# Patient Record
Sex: Female | Born: 1965 | Race: White | Hispanic: No | Marital: Married | Smoking: Never smoker
Health system: Southern US, Community
[De-identification: ages and names within clinical notes are randomized; demographics above are authoritative.]

## PROBLEM LIST (undated history)

## (undated) HISTORY — PX: WISDOM TOOTH EXTRACTION: SHX21

## (undated) HISTORY — PX: HERNIA REPAIR: SHX51

---

## 2004-10-03 ENCOUNTER — Other Ambulatory Visit: Admission: RE | Admit: 2004-10-03 | Discharge: 2004-10-03 | Payer: Self-pay | Admitting: Obstetrics and Gynecology

## 2005-10-08 ENCOUNTER — Other Ambulatory Visit: Admission: RE | Admit: 2005-10-08 | Discharge: 2005-10-08 | Payer: Self-pay | Admitting: Obstetrics and Gynecology

## 2008-12-20 ENCOUNTER — Ambulatory Visit (HOSPITAL_COMMUNITY): Admission: RE | Admit: 2008-12-20 | Discharge: 2008-12-20 | Payer: Self-pay | Admitting: Obstetrics and Gynecology

## 2009-12-15 ENCOUNTER — Ambulatory Visit: Payer: Self-pay | Admitting: Internal Medicine

## 2009-12-15 DIAGNOSIS — J309 Allergic rhinitis, unspecified: Secondary | ICD-10-CM | POA: Insufficient documentation

## 2009-12-15 LAB — CONVERTED CEMR LAB: Blood Glucose, Fingerstick: 102

## 2009-12-22 ENCOUNTER — Encounter: Payer: Self-pay | Admitting: Internal Medicine

## 2010-02-02 NOTE — Assessment & Plan Note (Signed)
Summary: BRAND NEW PT/TO EST/CJR   Vital Signs:  Patient profile:   45 year old female Height:      67 inches Weight:      168 pounds BMI:     26.41 Temp:     98.2 degrees F oral BP sitting:   100 / 70  (left arm) Cuff size:   regular  Vitals Entered By: Duard Brady LPN (December 15, 2009 1:48 PM) CC: new to establish - Is Patient Diabetic? No CBG Result 102   CC:  new to establish -.  History of Present Illness: 45 year old patient who is in today to establish with our practice.  She has enjoyed excellent health.  She has been followed by GYN and is considering surgery for stress urinary incontinence.  She has a history of mild allergic rhinitis.  Her chief complaint is low back pain that has been intermittently  bothersome for several weeks.  She has improved over the past couple of days however.  Preventive Screening-Counseling & Management  Alcohol-Tobacco     Smoking Status: never  Allergies (verified): 1)  ! Erythromycin  Past History:  Past Medical History: low-back pain history of PMS stress urinary incontinence Allergic rhinitis  Family History: Reviewed history and no changes required. father at age 21- history of type 2 diabetes hypertriglyceridemia, hypertension mother, age 9, low back pain, dyslipidemia  One brother, history of peptic ulcer disease  Social History: Reviewed history and no changes required. 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    Physical Exam  General:  Well-developed,well-nourished,in no acute distress; alert,appropriate and cooperative throughout examination Head:  Normocephalic and atraumatic without obvious abnormalities. No apparent alopecia or balding. Eyes:  No corneal or conjunctival inflammation noted. EOMI. Perrla. Funduscopic exam benign, without hemorrhages, exudates or papilledema. Vision grossly normal. Ears:  External ear exam shows no significant lesions or deformities.  Otoscopic examination reveals clear canals, tympanic membranes are intact bilaterally without bulging, retraction, inflammation or discharge. Hearing is grossly normal bilaterally. Mouth:  Oral mucosa and oropharynx without lesions or exudates.  Teeth in good repair. Neck:  No deformities, masses, or tenderness noted. Chest Wall:  No deformities, masses, or tenderness noted. Lungs:  Normal respiratory effort, chest expands symmetrically. Lungs are clear to auscultation, no crackles or wheezes. Heart:  Normal rate and regular rhythm. S1 and S2 normal without gallop, murmur, click, rub or other extra sounds. Abdomen:  Bowel sounds positive,abdomen soft and non-tender without masses, organomegaly or hernias noted. Msk:  No deformity or scoliosis noted of thoracic or lumbar spine.    negative straight leg test Pulses:  R and L carotid,radial,femoral,dorsalis pedis and posterior tibial pulses are full and equal bilaterally Extremities:  No clubbing, cyanosis, edema, or deformity noted with normal full range of motion of all joints.   Neurologic:  strength normal in all extremities, sensation intact to pinprick, gait normal, and finger-to-nose normal.   Skin:  Intact without suspicious lesions or rashes Cervical Nodes:  No lymphadenopathy noted Axillary Nodes:  No palpable lymphadenopathy Inguinal Nodes:  No significant adenopathy Psych:  Cognition and  judgment appear intact. Alert and cooperative with normal attention span and concentration. No apparent delusions, illusions, hallucinations   Impression & Recommendations:  Problem # 1:  Preventive Health Care (ICD-V70.0)  Complete Medication List: 1)  Claritin 10 Mg Tabs (Loratadine) .... Qd 2)  Fluoxetine Hcl 20 Mg Caps (Fluoxetine hcl) .... Once daily 3)  Cyclobenzaprine Hcl 10 Mg Tabs (Cyclobenzaprine hcl) .... One every 8 hours as needed for back pain  Other Orders: Capillary Blood Glucose/CBG (91478)  Patient Instructions: 1)  Most patients (90%) with low back pain will improve with time (2-6 weeks). Keep active but avoid activities that are painful. Apply moist heat and/or ice to lower back several times a day. 2)  Aleve two tablets twice daily as needed  Prescriptions: CYCLOBENZAPRINE HCL 10 MG TABS (CYCLOBENZAPRINE HCL) one every 8 hours as needed for back pain  #30 x 4   Entered and Authorized by:   Gordy Savers  MD   Signed by:   Gordy Savers  MD on 12/15/2009   Method used:   Print then Give to Patient   RxID:   [id number]  Low  Orders Added: 1)  New Patient 40-64 years [99386] 2)  Capillary Blood Glucose/CBG [[id number]]

## 2010-02-02 NOTE — Letter (Signed)
Summary: PATIENT HX FORM  PATIENT HX FORM   Imported By: Georgian Co 12/22/2009 14:23:18  _____________________________________________________________________  External Attachment:    Type:   Image     Comment:   External Document

## 2010-10-17 ENCOUNTER — Other Ambulatory Visit (HOSPITAL_COMMUNITY): Payer: Self-pay | Admitting: Obstetrics and Gynecology

## 2010-10-17 DIAGNOSIS — Z1231 Encounter for screening mammogram for malignant neoplasm of breast: Secondary | ICD-10-CM

## 2010-11-08 ENCOUNTER — Ambulatory Visit (HOSPITAL_COMMUNITY)
Admission: RE | Admit: 2010-11-08 | Discharge: 2010-11-08 | Disposition: A | Discharge status: Home / Self Care | Payer: BC Managed Care – PPO | Source: Ambulatory Visit | Attending: Obstetrics and Gynecology | Admitting: Obstetrics and Gynecology

## 2010-11-08 DIAGNOSIS — Z1231 Encounter for screening mammogram for malignant neoplasm of breast: Secondary | ICD-10-CM | POA: Insufficient documentation

## 2011-10-29 ENCOUNTER — Encounter: Payer: Self-pay | Admitting: Obstetrics and Gynecology

## 2011-10-29 ENCOUNTER — Ambulatory Visit (INDEPENDENT_AMBULATORY_CARE_PROVIDER_SITE_OTHER): Payer: BC Managed Care – PPO | Admitting: Obstetrics and Gynecology

## 2011-10-29 VITALS — BP 108/60 | HR 66 | Ht 66.5 in | Wt 160.0 lb

## 2011-10-29 DIAGNOSIS — Z124 Encounter for screening for malignant neoplasm of cervix: Secondary | ICD-10-CM

## 2011-10-29 MED ORDER — FLUOXETINE HCL 20 MG PO TABS: 20.0000 mg | ORAL_TABLET | Freq: Every day | ORAL | Status: AC

## 2011-10-29 MED ORDER — CLOTRIMAZOLE-BETAMETHASONE 1-0.05 % EX CREA: TOPICAL_CREAM | Freq: Two times a day (BID) | CUTANEOUS | Status: DC

## 2011-10-29 NOTE — Progress Notes (Signed)
Last Pap: 10/17/10 WNL: Yes Regular Periods:yes Contraception: husband vasectomy   Monthly Breast exam:no Tetanus<64yrs:no pt unsure  Nl.Bladder Function:no stress incontinence  Daily BMs:yes Healthy Diet:yes Calcium:no Mammogram:yes Date of Mammogram: 11/08/10 Exercise:yes Have often Exercise: twice per week  Seatbelt: yes Abuse at home: no Stressful work:no Sigmoid-colonoscopy: n/a Bone Density: No PCP: Dr. Amador Cunas Change in PMH: none Change in WUJ:WJXB BP 108/60  Pulse 66  Ht 5' 6.5" (1.689 m)  Wt 160 lb (72.576 kg)  BMI 25.44 kg/m2  LMP 10/21/2011 Pt with complaints:no Physical Examination: General appearance - alert, well appearing, and in no distress Mental status - normal mood, behavior, speech, dress, motor activity, and thought processes Neck - supple, no significant adenopathy,  thyroid exam: thyroid is normal in size without nodules or tenderness Chest - clear to auscultation, no wheezes, rales or rhonchi, symmetric air entry Heart - normal rate and regular rhythm Abdomen - soft, nontender, nondistended, no masses or organomegaly Breasts - breasts appear normal, no suspicious masses, no skin or nipple changes or axillary nodes Pelvic - normal external genitalia, vulva, vagina, cervix, uterus and adnexa Rectal - rectal exam not indicated Back exam - full range of motion, no tenderness, palpable spasm or pain on motion Neurological - alert, oriented, normal speech, no focal findings or movement disorder noted Musculoskeletal - no joint tenderness, deformity or swelling Extremities - no edema, redness or tenderness in the calves or thighs Skin - normal coloration and turgor, no rashes, no suspicious skin lesions noted Routine exam Pap sent yes Mammogram due ye pt will schedule vasectomy used for contraception RT 1 yr.  Pt desires to continue fluoxitine for PMS lotrisone rx for yeast on buttock.  hygeine also reviewed

## 2011-10-30 LAB — PAP IG W/ RFLX HPV ASCU

## 2011-11-19 ENCOUNTER — Ambulatory Visit (INDEPENDENT_AMBULATORY_CARE_PROVIDER_SITE_OTHER): Payer: BC Managed Care – PPO | Admitting: Internal Medicine

## 2011-11-19 ENCOUNTER — Encounter: Payer: Self-pay | Admitting: Internal Medicine

## 2011-11-19 VITALS — BP 116/80 | HR 68 | Temp 98.0°F | Resp 16 | Wt 160.0 lb

## 2011-11-19 DIAGNOSIS — L259 Unspecified contact dermatitis, unspecified cause: Secondary | ICD-10-CM

## 2011-11-19 DIAGNOSIS — J309 Allergic rhinitis, unspecified: Secondary | ICD-10-CM

## 2011-11-19 MED ORDER — METHYLPREDNISOLONE ACETATE 80 MG/ML IJ SUSP
80.0000 mg | Freq: Once | INTRAMUSCULAR | Status: AC
  Administered 2011-11-19: 80 mg via INTRAMUSCULAR

## 2011-11-19 MED ORDER — TRIAMCINOLONE ACETONIDE 0.1 % EX CREA: TOPICAL_CREAM | Freq: Two times a day (BID) | CUTANEOUS | Status: DC

## 2011-11-19 NOTE — Patient Instructions (Signed)
Contact Dermatitis Contact dermatitis is a rash that happens when something touches the skin. You touched something that irritates your skin, or you have allergies to something you touched. HOME CARE   Avoid the thing that caused your rash.  Keep your rash away from hot water, soap, sunlight, chemicals, and other things that might bother it.  Do not scratch your rash.  You can take cool baths to help stop itching.  Only take medicine as told by your doctor.  Keep all doctor visits as told. GET HELP RIGHT AWAY IF:   Your rash is not better after 3 days.  Your rash gets worse.  Your rash is puffy (swollen), tender, red, sore, or warm.  You have problems with your medicine. MAKE SURE YOU:   Understand these instructions.  Will watch your condition.  Will get help right away if you are not doing well or get worse. Document Released: 10/15/2008 Document Revised: 03/12/2011 Document Reviewed: 05/23/2010 [employer] Patient Information 2013 [employer].  

## 2011-11-19 NOTE — Progress Notes (Signed)
  Subjective:    Patient ID: Christie Lara, female    DOB: 06-16-1965, 46 y.o.   MRN: [id number]  HPI  46 year old patient who is seen today with a chief complaint of a rash.  This began for 5 days ago and initially began on her left hand and for head. Assess for spread to the right hand abdomen and neck area. 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 Rash is quite pruritic. She has been using calamine lotion with benefit  No past medical history on file.  History   Social History  . Marital Status: Married    Spouse Name: N/A    Number of Children: N/A  . Years of Education: N/A   Occupational History  . Not on file.   Social History Main Topics  . Smoking status: Never Smoker   . Smokeless tobacco: Not on file  . Alcohol Use: Yes     Comment: glass of wine at dinner  . Drug Use: Not on file  . Sexually Active: Yes    Birth Control/ Protection: Surgical     Comment: husband vasectomy   Other Topics Concern  . Not on file   Social History Narrative  . No narrative on file    No past surgical history on file.  No family history on file.  Allergies  Allergen Reactions  . Erythromycin     REACTION: nausea    Current Outpatient Prescriptions on File Prior to Visit  Medication Sig Dispense Refill  . clotrimazole-betamethasone (LOTRISONE) cream Apply topically 2 (two) times daily. Use for 7- 10 days as needed  30 g  0  . FLUoxetine (PROZAC) 20 MG tablet Take 1 tablet (20 mg total) by mouth daily.  30 tablet  11  . loratadine-pseudoephedrine (CLARITIN-D 12-HOUR) 5-120 MG per tablet Take 1 tablet by mouth 2 (two) times daily.        BP 116/80  Pulse 68  Temp 98 F (36.7 C) (Oral)  Resp 16  Wt 160 lb (72.576 kg)  LMP 10/21/2011       Review of Systems  Constitutional: Negative.   HENT: Negative for hearing loss, congestion, sore throat, rhinorrhea, dental problem, sinus pressure and tinnitus.   Eyes: Negative for pain,  discharge and visual disturbance.  Respiratory: Negative for cough and shortness of breath.   Cardiovascular: Negative for chest pain, palpitations and leg swelling.  Gastrointestinal: Negative for nausea, vomiting, abdominal pain, diarrhea, constipation, blood in stool and abdominal distention.  Genitourinary: Negative for dysuria, urgency, frequency, hematuria, flank pain, vaginal bleeding, vaginal discharge, difficulty urinating, vaginal pain and pelvic pain.  Musculoskeletal: Negative for joint swelling, arthralgias and gait problem.  Skin: Positive for rash.  Neurological: Negative for dizziness, syncope, speech difficulty, weakness, numbness and headaches.  Hematological: Negative for adenopathy.  Psychiatric/Behavioral: Negative for behavioral problems, dysphoric mood and agitation. The patient is not nervous/anxious.        Objective:   Physical Exam  Constitutional: She appears well-developed and well-nourished. No distress.  Skin: Rash noted.       Scattered areas of erythema with papules and scaling involving primarily the hands face left shoulder and abdomen. Consistent with a contact dermatitis.          Assessment & Plan:    Content dermatitis. Will treat with Depo-Medrol. Will also give the prescription for triamcinolone. She will avoid the facial areas

## 2011-11-23 ENCOUNTER — Encounter: Payer: Self-pay | Admitting: Family Medicine

## 2011-11-23 ENCOUNTER — Telehealth: Payer: Self-pay | Admitting: Family Medicine

## 2011-11-23 ENCOUNTER — Ambulatory Visit (INDEPENDENT_AMBULATORY_CARE_PROVIDER_SITE_OTHER): Payer: BC Managed Care – PPO | Admitting: Family Medicine

## 2011-11-23 VITALS — BP 100/70 | HR 83 | Temp 98.0°F | Wt 156.0 lb

## 2011-11-23 DIAGNOSIS — L259 Unspecified contact dermatitis, unspecified cause: Secondary | ICD-10-CM

## 2011-11-23 MED ORDER — PREDNISONE 20 MG PO TABS: ORAL_TABLET | ORAL | Status: DC

## 2011-11-23 NOTE — Telephone Encounter (Signed)
error 

## 2011-11-23 NOTE — Progress Notes (Signed)
Chief Complaint  Patient presents with  . Abrasion    no better since Monday     HPI:  Here for acute visit for rash: -started: about 1 week ago -on [facility] saw PCP (4days ago) 11/18 for rash and dx with contact dermatitis - tx with depo-medrol inj and triamcinolone -rash is on L hand, right side of hand, neck -new spots were happening before seen, not spreading much since last visit, but still itchy and not resolved, maybe some new spots on belly, none near eye and eyes ok, no swelling of lips or mouth or trouble breathing -doesn't know of exposure, does have dogs that go outside -denies: fevers, chills, NVD  ROS: See pertinent positives and negatives per HPI.  No past medical history on file.  No family history on file.  History   Social History  . Marital Status: Married    Spouse Name: N/A    Number of Children: N/A  . Years of Education: N/A   Social History Main Topics  . Smoking status: Never Smoker   . Smokeless tobacco: None  . Alcohol Use: Yes     Comment: glass of wine at dinner  . Drug Use: None  . Sexually Active: Yes    Birth Control/ Protection: Surgical     Comment: husband vasectomy   Other Topics Concern  . None   Social History Narrative  . None    Current outpatient prescriptions:clotrimazole-betamethasone (LOTRISONE) cream, Apply topically 2 (two) times daily. Use for 7- 10 days as needed, Disp: 30 g, Rfl: 0;  FLUoxetine (PROZAC) 20 MG tablet, Take 1 tablet (20 mg total) by mouth daily., Disp: 30 tablet, Rfl: 11;  loratadine-pseudoephedrine (CLARITIN-D 12-HOUR) 5-120 MG per tablet, Take 1 tablet by mouth 2 (two) times daily., Disp: , Rfl:  triamcinolone cream (KENALOG) 0.1 %, Apply topically 2 (two) times daily., Disp: 30 g, Rfl: 0;  predniSONE (DELTASONE) 20 MG tablet, 3 tablets daily for 2 days, then 2 tablets daily for 4 days, then 1 tablet daily for 4 days, then 1/2 tablet daily for 4 days, Disp: 20 tablet, Rfl: 0  EXAM:  Filed Vitals:   11/23/11 1358  BP: 100/70  Pulse: 83  Temp: 98 F (36.7 C)    There is no height on file to calculate BMI.  GENERAL: vitals reviewed and listed above, alert, oriented, appears well hydrated and in no acute distress  SKIN: scaly erythematous papules and plaques on hands, abdomen, neck, flank and side of face  MS: moves all extremities without noticeable abnormality  PSYCH: pleasant and cooperative, no obvious depression or anxiety  ASSESSMENT AND PLAN:  Discussed the following assessment and plan:  1. Contact dermatitis  predniSONE (DELTASONE) 20 MG tablet Calamine, Claritin, return precuation   -discussed does appear to be contact dermatitis and looks like toxicodendron dermatitis progression had halted with steroid injection, but now spreading again -given extensive area and that there are lesions on face discussed risks/versus benefit of oral steroid taper -discussed that there is a potential for rebound when steroid stopped in small number of cases -pt would like to do oral steroid and will follow up with PCP in 2 weeks -Patient advised to return or notify a doctor immediately if symptoms worsen or persist or new concerns arise.  Patient Instructions  Contact Dermatitis Contact dermatitis is a reaction to certain substances that touch the skin. Contact dermatitis can be either irritant contact dermatitis or allergic contact dermatitis. Irritant contact dermatitis does not require previous  exposure to the substance for a reaction to occur.Allergic contact dermatitis only occurs if you have been exposed to the substance before. Upon a repeat exposure, your body reacts to the substance.  CAUSES  Many substances can cause contact dermatitis. Irritant dermatitis is most commonly caused by repeated exposure to mildly irritating substances, such as:  Makeup.  Plants  Soaps.  Detergents.  Bleaches.  Acids.  Metal salts, such as nickel. Allergic contact dermatitis is most  commonly caused by exposure to:  Poisonous plants.  Chemicals (deodorants, shampoos).  Jewelry.  Latex.  Neomycin in triple antibiotic cream.  Preservatives in products, including clothing. SYMPTOMS  The area of skin that is exposed may develop:  Dryness or flaking.  Redness.  Cracks.  Itching.  Pain or a burning sensation.  Blisters. With allergic contact dermatitis, there may also be swelling in areas such as the eyelids, mouth, or genitals.  DIAGNOSIS  Your caregiver can usually tell what the problem is by doing a physical exam. In cases where the cause is uncertain and an allergic contact dermatitis is suspected, a patch skin test may be performed to help determine the cause of your dermatitis. TREATMENT Treatment includes protecting the skin from further contact with the irritating substance by avoiding that substance if possible. Barrier creams, powders, and gloves may be helpful. Your caregiver may also recommend:  Steroid creams or ointments applied 2 times daily. For best results, soak the rash area in cool water for 20 minutes. Then apply the medicine. Cover the area with a plastic wrap. You can store the steroid cream in the refrigerator for a "chilly" effect on your rash. That may decrease itching. Oral steroid medicines may be needed in more severe cases.  Antibiotics or antibacterial ointments if a skin infection is present.  Antihistamine lotion or an antihistamine taken by mouth to ease itching.  Lubricants to keep moisture in your skin.  Burow's solution to reduce redness and soreness or to dry a weeping rash. Mix one packet or tablet of solution in 2 cups cool water. Dip a clean washcloth in the mixture, wring it out a bit, and put it on the affected area. Leave the cloth in place for 30 minutes. Do this as often as possible throughout the day.  Taking several cornstarch or baking soda baths daily if the area is too large to cover with a washcloth. Harsh  chemicals, such as alkalis or acids, can cause skin damage that is like a burn. You should flush your skin for 15 to 20 minutes with cold water after such an exposure. You should also seek immediate medical care after exposure. Bandages (dressings), antibiotics, and pain medicine may be needed for severely irritated skin.  HOME CARE INSTRUCTIONS  Avoid the substance that caused your reaction.  Keep the area of skin that is affected away from hot water, soap, sunlight, chemicals, acidic substances, or anything else that would irritate your skin.  Do not scratch the rash. Scratching may cause the rash to become infected.  You may take cool baths to help stop the itching.  Only take over-the-counter or prescription medicines as directed by your caregiver.  See your caregiver for follow-up care as directed to make sure your skin is healing properly. SEEK MEDICAL CARE IF:   Your condition is not better after 3 days of treatment.  You seem to be getting worse.  You see signs of infection such as swelling, tenderness, redness, soreness, or warmth in the affected area.  You  have any problems related to your medicines. Document Released: 12/16/1999 Document Revised: 03/12/2011 Document Reviewed: 05/23/2010 [facility] Patient Information 2013 [place], [place], [place].

## 2011-11-23 NOTE — Patient Instructions (Addendum)
Contact Dermatitis Contact dermatitis is a reaction to certain substances that touch the skin. Contact dermatitis can be either irritant contact dermatitis or allergic contact dermatitis. Irritant contact dermatitis does not require previous exposure to the substance for a reaction to occur.Allergic contact dermatitis only occurs if you have been exposed to the substance before. Upon a repeat exposure, your body reacts to the substance.  CAUSES  Many substances can cause contact dermatitis. Irritant dermatitis is most commonly caused by repeated exposure to mildly irritating substances, such as:  Makeup.  Plants  Soaps.  Detergents.  Bleaches.  Acids.  Metal salts, such as nickel. Allergic contact dermatitis is most commonly caused by exposure to:  Poisonous plants.  Chemicals (deodorants, shampoos).  Jewelry.  Latex.  Neomycin in triple antibiotic cream.  Preservatives in products, including clothing. SYMPTOMS  The area of skin that is exposed may develop:  Dryness or flaking.  Redness.  Cracks.  Itching.  Pain or a burning sensation.  Blisters. With allergic contact dermatitis, there may also be swelling in areas such as the eyelids, mouth, or genitals.  DIAGNOSIS  Your caregiver can usually tell what the problem is by doing a physical exam. In cases where the cause is uncertain and an allergic contact dermatitis is suspected, a patch skin test may be performed to help determine the cause of your dermatitis. TREATMENT Treatment includes protecting the skin from further contact with the irritating substance by avoiding that substance if possible. Barrier creams, powders, and gloves may be helpful. Your caregiver may also recommend:  Steroid creams or ointments applied 2 times daily. For best results, soak the rash area in cool water for 20 minutes. Then apply the medicine. Cover the area with a plastic wrap. You can store the steroid cream in the refrigerator for  a "chilly" effect on your rash. That may decrease itching. Oral steroid medicines may be needed in more severe cases.  Antibiotics or antibacterial ointments if a skin infection is present.  Antihistamine lotion or an antihistamine taken by mouth to ease itching.  Lubricants to keep moisture in your skin.  Burow's solution to reduce redness and soreness or to dry a weeping rash. Mix one packet or tablet of solution in 2 cups cool water. Dip a clean washcloth in the mixture, wring it out a bit, and put it on the affected area. Leave the cloth in place for 30 minutes. Do this as often as possible throughout the day.  Taking several cornstarch or baking soda baths daily if the area is too large to cover with a washcloth. Harsh chemicals, such as alkalis or acids, can cause skin damage that is like a burn. You should flush your skin for 15 to 20 minutes with cold water after such an exposure. You should also seek immediate medical care after exposure. Bandages (dressings), antibiotics, and pain medicine may be needed for severely irritated skin.  HOME CARE INSTRUCTIONS  Avoid the substance that caused your reaction.  Keep the area of skin that is affected away from hot water, soap, sunlight, chemicals, acidic substances, or anything else that would irritate your skin.  Do not scratch the rash. Scratching may cause the rash to become infected.  You may take cool baths to help stop the itching.  Only take over-the-counter or prescription medicines as directed by your caregiver.  See your caregiver for follow-up care as directed to make sure your skin is healing properly. SEEK MEDICAL CARE IF:   Your condition is not better  after 3 days of treatment.  You seem to be getting worse.  You see signs of infection such as swelling, tenderness, redness, soreness, or warmth in the affected area.  You have any problems related to your medicines. Document Released: 12/16/1999 Document Revised:  03/12/2011 Document Reviewed: 05/23/2010 [facility] Patient Information 2013 [place], [place].

## 2011-12-05 ENCOUNTER — Ambulatory Visit: Payer: BC Managed Care – PPO | Admitting: Internal Medicine

## 2012-05-23 ENCOUNTER — Other Ambulatory Visit: Payer: Self-pay | Admitting: Obstetrics and Gynecology

## 2012-05-23 DIAGNOSIS — Z1231 Encounter for screening mammogram for malignant neoplasm of breast: Secondary | ICD-10-CM

## 2012-05-29 ENCOUNTER — Ambulatory Visit (HOSPITAL_COMMUNITY)
Admission: RE | Admit: 2012-05-29 | Discharge: 2012-05-29 | Disposition: A | Discharge status: Home / Self Care | Payer: BC Managed Care – PPO | Source: Ambulatory Visit | Attending: Obstetrics and Gynecology | Admitting: Obstetrics and Gynecology

## 2012-05-29 DIAGNOSIS — Z1231 Encounter for screening mammogram for malignant neoplasm of breast: Secondary | ICD-10-CM

## 2013-03-20 ENCOUNTER — Ambulatory Visit (INDEPENDENT_AMBULATORY_CARE_PROVIDER_SITE_OTHER): Payer: BC Managed Care – PPO | Admitting: Physician Assistant

## 2013-03-20 VITALS — BP 108/64 | HR 80 | Temp 97.6°F | Ht 66.5 in | Wt 145.8 lb

## 2013-03-20 DIAGNOSIS — N390 Urinary tract infection, site not specified: Secondary | ICD-10-CM

## 2013-03-20 DIAGNOSIS — R300 Dysuria: Secondary | ICD-10-CM

## 2013-03-20 DIAGNOSIS — R350 Frequency of micturition: Secondary | ICD-10-CM

## 2013-03-20 LAB — POCT URINALYSIS DIPSTICK
Bilirubin, UA: NEGATIVE
Glucose, UA: NEGATIVE
Ketones, UA: NEGATIVE
Nitrite, UA: NEGATIVE
Spec Grav, UA: <=1.005
Urobilinogen, UA: 0.2
pH, UA: 7

## 2013-03-20 LAB — POCT UA - MICROSCOPIC ONLY
Casts, Ur, LPF, POC: NEGATIVE
Crystals, Ur, HPF, POC: NEGATIVE
Mucus, UA: NEGATIVE
RBC, urine, microscopic: NEGATIVE
Yeast, UA: NEGATIVE

## 2013-03-20 MED ORDER — PHENAZOPYRIDINE HCL 200 MG PO TABS: 200.0000 mg | ORAL_TABLET | Freq: Three times a day (TID) | ORAL | Status: DC | PRN

## 2013-03-20 MED ORDER — CIPROFLOXACIN HCL 250 MG PO TABS: 250.0000 mg | ORAL_TABLET | Freq: Two times a day (BID) | ORAL | Status: DC

## 2013-03-20 NOTE — Progress Notes (Signed)
   Subjective:    Patient ID: Christie Lara, female    DOB: 03/14/1965, 48 y.o.   MRN: [id number]  HPI [age] year female presents for evaluation of possible UTI. She woke up this morning with suprapubic pressure, dysuria, and urinary frequency. Has had slight nausea and lower abdominal pain. Denies emesis, fever, chills, flank pain, vaginal discharge, odor, or hematuria.  She has had UTI's in the past but it was "years" ago.  No OTC treatments tried.  Patient is otherwise healthy with no other concerns today.     Review of Systems  Constitutional: Negative for fever.  Gastrointestinal: Positive for nausea and abdominal pain. Negative for vomiting.  Genitourinary: Positive for dysuria and frequency. Negative for flank pain, vaginal discharge and pelvic pain.       Objective:   Physical Exam  Constitutional: She is oriented to person, place, and time. She appears well-developed and well-nourished.  HENT:  Head: Normocephalic and atraumatic.  Right Ear: External ear normal.  Left Ear: External ear normal.  Eyes: Conjunctivae are normal.  Neck: Normal range of motion.  Cardiovascular: Normal rate, regular rhythm and normal heart sounds.   Pulmonary/Chest: Effort normal and breath sounds normal.  Abdominal: There is tenderness in the suprapubic area. There is no rebound, no guarding and no CVA tenderness.  Neurological: She is alert and oriented to person, place, and time.  Psychiatric: She has a normal mood and affect. Her behavior is normal. Judgment and thought content normal.     Results for orders placed in visit on 03/20/13  POCT UA - MICROSCOPIC ONLY      Result Value Ref Range   WBC, Ur, HPF, POC TNTC     RBC, urine, microscopic neg     Bacteria, U Microscopic 1+     Mucus, UA neg     Epithelial cells, urine per micros 1-4     Crystals, Ur, HPF, POC neg     Casts, Ur, LPF, POC neg     Yeast, UA neg     Clue Cells Wet Prep HPF POC Moderate    POCT URINALYSIS DIPSTICK    Result Value Ref Range   Color, UA lt yellow     Clarity, UA cloudy     Glucose, UA neg     Bilirubin, UA neg     Ketones, UA neg     Spec Grav, UA <=1.005     Blood, UA large     pH, UA 7.0     Protein, UA trace     Urobilinogen, UA 0.2     Nitrite, UA neg     Leukocytes, UA large (3+)          Assessment & Plan:   UTI (urinary tract infection)  Dysuria - Plan: POCT UA - Microscopic Only, POCT urinalysis dipstick, ciprofloxacin (CIPRO) 250 MG tablet, phenazopyridine (PYRIDIUM) 200 MG tablet, Urine culture  Urinary frequency  Will treat UTI with Cipro 250 mg bid x 5 days Pyridium 200 mg tid prn pain. Increase fluids and rest Urine culture sent RTC precautions discussed. Follow up if symptoms worsen or fail to improve.

## 2013-03-22 LAB — URINE CULTURE: Colony Count: >=100000

## 2013-07-02 ENCOUNTER — Encounter: Payer: Self-pay | Admitting: Internal Medicine

## 2013-07-02 ENCOUNTER — Ambulatory Visit (INDEPENDENT_AMBULATORY_CARE_PROVIDER_SITE_OTHER): Payer: BC Managed Care – PPO | Admitting: Internal Medicine

## 2013-07-02 VITALS — BP 108/70 | HR 74 | Temp 98.5°F | Resp 18 | Ht 66.5 in | Wt 149.0 lb

## 2013-07-02 DIAGNOSIS — L259 Unspecified contact dermatitis, unspecified cause: Secondary | ICD-10-CM

## 2013-07-02 MED ORDER — METHYLPREDNISOLONE ACETATE 80 MG/ML IJ SUSP
80.0000 mg | Freq: Once | INTRAMUSCULAR | Status: AC
  Administered 2013-07-02: 80 mg via INTRAMUSCULAR

## 2013-07-02 NOTE — Patient Instructions (Signed)
Call or return to clinic prn if these symptoms worsen or fail to improve as anticipated. Poison [employer] Poison ivy is a inflammation of the skin (contact dermatitis) caused by touching the allergens on the leaves of the ivy plant following previous exposure to the plant. The rash usually appears 48 hours after exposure. The rash is usually bumps (papules) or blisters (vesicles) in a linear pattern. Depending on your own sensitivity, the rash may simply cause redness and itching, or it may also progress to blisters which may break open. These must be well cared for to prevent secondary bacterial (germ) infection, followed by scarring. Keep any open areas dry, clean, dressed, and covered with an antibacterial ointment if needed. The eyes may also get puffy. The puffiness is worst in the morning and gets better as the day progresses. This dermatitis usually heals without scarring, within 2 to 3 weeks without treatment. HOME CARE INSTRUCTIONS  Thoroughly wash with soap and water as soon as you have been exposed to poison ivy. You have about one half hour to remove the plant resin before it will cause the rash. This washing will destroy the oil or antigen on the skin that is causing, or will cause, the rash. Be sure to wash under your fingernails as any plant resin there will continue to spread the rash. Do not rub skin vigorously when washing affected area. Poison ivy cannot spread if no oil from the plant remains on your body. A rash that has progressed to weeping sores will not spread the rash unless you have not washed thoroughly. It is also important to wash any clothes you have been wearing as these may carry active allergens. The rash will return if you wear the unwashed clothing, even several days later. Avoidance of the plant in the future is the best measure. Poison ivy plant can be recognized by the number of leaves. Generally, poison ivy has three leaves with flowering branches on a single  stem. Diphenhydramine may be purchased over the counter and used as needed for itching. Do not drive with this medication if it makes you drowsy.Ask your caregiver about medication for children. SEEK MEDICAL CARE IF:  Open sores develop.  Redness spreads beyond area of rash.  You notice purulent (pus-like) discharge.  You have increased pain.  Other signs of infection develop (such as fever). Document Released: 12/16/1999 Document Revised: 03/12/2011 Document Reviewed: 11/03/2008 [facility] Patient Information 2015 [place], [place]. This information is not intended to replace advice given to you by your health care provider. Make sure you discuss any questions you have with your health care provider.

## 2013-07-02 NOTE — Progress Notes (Signed)
Pre visit review using our clinic review tool, if applicable. No additional management support is needed unless otherwise documented below in the visit note. 

## 2013-07-02 NOTE — Addendum Note (Signed)
Addended by: Jimmye NormanPHANOS, Emberlie Gotcher J on: 07/02/2013 05:03 PM   Modules accepted: Orders

## 2013-07-02 NOTE — Progress Notes (Signed)
   Subjective:    Patient ID: Christie Lara, female    DOB: 03/08/1965, 48 y.o.   MRN: [id number]  HPI 48 year old patient, who presents with a one-week history of a rash involving primarily the lower extremities.  She describes a pruritic papules and vesicles some in a linear distribution.  No obvious exposure history, but she does do some gardening and does have a dog  History reviewed. No pertinent past medical history.  History   Social History  . Marital Status: Married    Spouse Name: N/A    Number of Children: N/A  . Years of Education: N/A   Occupational History  . Not on file.   Social History Main Topics  . Smoking status: Never Smoker   . Smokeless tobacco: Not on file  . Alcohol Use: Yes     Comment: glass of wine at dinner  . Drug Use: No  . Sexual Activity: Yes    Birth Control/ Protection: Surgical     Comment: husband vasectomy   Other Topics Concern  . Not on file   Social History Narrative  . No narrative on file    History reviewed. No pertinent past surgical history.  Family History  Problem Relation Age of Onset  . Hyperlipidemia Mother   . Diabetes Father   . Hypertension Father   . Cancer Father     Allergies  Allergen Reactions  . Erythromycin     REACTION: nausea    Current Outpatient Prescriptions on File Prior to Visit  Medication Sig Dispense Refill  . FLUoxetine (PROZAC) 20 MG tablet Take 1 tablet (20 mg total) by mouth daily.  30 tablet  11  . loratadine-pseudoephedrine (CLARITIN-D 12-HOUR) 5-120 MG per tablet Take 1 tablet by mouth 2 (two) times daily.       No current facility-administered medications on file prior to visit.    BP 108/70  Pulse 74  Temp(Src) 98.5 F (36.9 C) (Oral)  Resp 18  Ht 5' 6.5" (1.689 m)  Wt 149 lb (67.586 kg)  BMI 23.69 kg/m2  SpO2 98%      Review of Systems  Constitutional: Negative.   HENT: Negative for congestion, dental problem, hearing loss, rhinorrhea, sinus pressure, sore  throat and tinnitus.   Eyes: Negative for pain, discharge and visual disturbance.  Respiratory: Negative for cough and shortness of breath.   Cardiovascular: Negative for chest pain, palpitations and leg swelling.  Gastrointestinal: Negative for nausea, vomiting, abdominal pain, diarrhea, constipation, blood in stool and abdominal distention.  Genitourinary: Negative for dysuria, urgency, frequency, hematuria, flank pain, vaginal bleeding, vaginal discharge, difficulty urinating, vaginal pain and pelvic pain.  Musculoskeletal: Negative for arthralgias, gait problem and joint swelling.  Skin: Positive for rash.  Neurological: Negative for dizziness, syncope, speech difficulty, weakness, numbness and headaches.  Hematological: Negative for adenopathy.  Psychiatric/Behavioral: Negative for behavioral problems, dysphoric mood and agitation. The patient is not nervous/anxious.        Objective:   Physical Exam  Constitutional: She appears well-nourished. No distress.  Skin: Rash noted.  Scattered erythematous papules and vesicles with the some in linear distribution over the lower extremities          Assessment & Plan:   Contact dermatitis.  Will treat with Depo-Medrol 80 Local skin care discussed

## 2013-10-01 ENCOUNTER — Ambulatory Visit (INDEPENDENT_AMBULATORY_CARE_PROVIDER_SITE_OTHER): Payer: BC Managed Care – PPO | Admitting: Family Medicine

## 2013-10-01 VITALS — BP 102/66 | HR 72 | Temp 97.9°F | Resp 18 | Ht 67.5 in | Wt 146.0 lb

## 2013-10-01 DIAGNOSIS — N3001 Acute cystitis with hematuria: Secondary | ICD-10-CM

## 2013-10-01 DIAGNOSIS — R110 Nausea: Secondary | ICD-10-CM

## 2013-10-01 DIAGNOSIS — R309 Painful micturition, unspecified: Secondary | ICD-10-CM

## 2013-10-01 LAB — POCT URINALYSIS DIPSTICK
Glucose, UA: 250
Ketones, UA: 15
Nitrite, UA: POSITIVE
Protein, UA: >=300
Spec Grav, UA: 1.01
Urobilinogen, UA: >=8
pH, UA: 5

## 2013-10-01 LAB — POCT UA - MICROSCOPIC ONLY
Casts, Ur, LPF, POC: NEGATIVE
Crystals, Ur, HPF, POC: NEGATIVE
Mucus, UA: NEGATIVE
Yeast, UA: NEGATIVE

## 2013-10-01 MED ORDER — PHENAZOPYRIDINE HCL 200 MG PO TABS: 200.0000 mg | ORAL_TABLET | Freq: Three times a day (TID) | ORAL | Status: DC | PRN

## 2013-10-01 MED ORDER — NITROFURANTOIN MONOHYD MACRO 100 MG PO CAPS: 100.0000 mg | ORAL_CAPSULE | Freq: Two times a day (BID) | ORAL | Status: DC

## 2013-10-01 NOTE — Patient Instructions (Signed)

## 2013-10-01 NOTE — Progress Notes (Signed)
Subjective:    Patient ID: Christie Lara, female    DOB: 04/13/1965, 48 y.o.   MRN: [id number] Chief Complaint  Patient presents with  . Dysuria    since monday   . Nausea    HPI  Developed sxs 3-4d prev - tried to really push fluids with some improvement but then yesterday started more ill. Chilled yesterday. Started pyridium, + sweats, no fevers. Slight nausea but no vomiting.  Having a little left flank pain but maybe due to exercising - feels like a pulled muscle. +Urinary freq and dysuria Stress incontinence at baseline - wears poise pads No constipation/diarrhea, vag d/c  History reviewed. No pertinent past medical history. Current Outpatient Prescriptions on File Prior to Visit  Medication Sig Dispense Refill  . FLUoxetine (PROZAC) 20 MG tablet Take 1 tablet (20 mg total) by mouth daily.  30 tablet  11  . loratadine-pseudoephedrine (CLARITIN-D 12-HOUR) 5-120 MG per tablet Take 1 tablet by mouth 2 (two) times daily.       No current facility-administered medications on file prior to visit.   Allergies  Allergen Reactions  . Erythromycin     REACTION: nausea     Review of Systems  Constitutional: Positive for chills, diaphoresis, activity change, appetite change and fatigue. Negative for fever and unexpected weight change.  Gastrointestinal: Positive for nausea. Negative for vomiting, abdominal pain, diarrhea, constipation, blood in stool, anal bleeding and rectal pain.  Genitourinary: Positive for dysuria, frequency and flank pain. Negative for urgency, hematuria, decreased urine volume, vaginal bleeding, vaginal discharge, difficulty urinating, genital sores, vaginal pain, menstrual problem, pelvic pain and dyspareunia.  Musculoskeletal: Positive for back pain and myalgias. Negative for gait problem.  Skin: Negative for rash.  Hematological: Negative for adenopathy.  Psychiatric/Behavioral: The patient is not nervous/anxious.        Objective:  BP 102/66  Pulse 72   Temp(Src) 97.9 F (36.6 C) (Oral)  Resp 18  Ht 5' 7.5" (1.715 m)  Wt 146 lb (66.225 kg)  BMI 22.52 kg/m2  SpO2 98%  LMP 09/29/2013  Physical Exam  Constitutional: She is oriented to person, place, and time. She appears well-developed and well-nourished. No distress.  HENT:  Head: Normocephalic and atraumatic.  Cardiovascular: Normal rate, regular rhythm, normal heart sounds and intact distal pulses.   Pulmonary/Chest: Effort normal and breath sounds normal.  Abdominal: Soft. Bowel sounds are normal. She exhibits no distension. There is no hepatosplenomegaly. There is no tenderness. There is no rebound, no guarding and no CVA tenderness.  Neurological: She is alert and oriented to person, place, and time.  Skin: Skin is warm and dry. She is not diaphoretic.  Psychiatric: She has a normal mood and affect. Her behavior is normal.      Results for orders placed in visit on 10/01/13  URINE CULTURE      Result Value Ref Range   Culture ESCHERICHIA COLI     Colony Count >=100,000 COLONIES/ML     Organism ID, Bacteria ESCHERICHIA COLI    POCT URINALYSIS DIPSTICK      Result Value Ref Range   Color, UA Orange     Clarity, UA Cloudy     Glucose, UA 250     Bilirubin, UA Mod     Ketones, UA 15     Spec Grav, UA 1.010     Blood, UA Large     pH, UA 5.0     Protein, UA >=300     Urobilinogen, UA >=8.0  Nitrite, UA Pos     Leukocytes, UA large (3+)    POCT UA - MICROSCOPIC ONLY      Result Value Ref Range   WBC, Ur, HPF, POC 9-18     RBC, urine, microscopic 0-3     Bacteria, U Microscopic 3+     Mucus, UA Neg     Epithelial cells, urine per micros 0-2     Crystals, Ur, HPF, POC Neg     Casts, Ur, LPF, POC Neg     Yeast, UA Neg         Assessment & Plan:   Pain with urination - Plan: POCT urinalysis dipstick, POCT UA - Microscopic Only  Nausea - Plan: POCT urinalysis dipstick, POCT UA - Microscopic Only  Acute cystitis with hematuria - Plan: Urine culture  Meds  ordered this encounter  Medications  . DISCONTD: phenazopyridine (PYRIDIUM) 200 MG tablet    Sig: Take 200 mg by mouth 3 (three) times daily as needed for pain.  . nitrofurantoin, macrocrystal-monohydrate, (MACROBID) 100 MG capsule    Sig: Take 1 capsule (100 mg total) by mouth 2 (two) times daily.    Dispense:  14 capsule    Refill:  0  . phenazopyridine (PYRIDIUM) 200 MG tablet    Sig: Take 1 tablet (200 mg total) by mouth 3 (three) times daily as needed for pain.    Dispense:  10 tablet    Refill:  0     Norberto Sorenson, MD MPH

## 2013-10-03 LAB — URINE CULTURE: Colony Count: >=100000

## 2015-01-14 ENCOUNTER — Other Ambulatory Visit: Payer: Self-pay

## 2015-01-14 DIAGNOSIS — Z1231 Encounter for screening mammogram for malignant neoplasm of breast: Secondary | ICD-10-CM

## 2015-02-03 ENCOUNTER — Ambulatory Visit
Admission: RE | Admit: 2015-02-03 | Discharge: 2015-02-03 | Disposition: A | Discharge status: Home / Self Care | Payer: BC Managed Care – PPO | Source: Ambulatory Visit

## 2015-02-03 DIAGNOSIS — Z1231 Encounter for screening mammogram for malignant neoplasm of breast: Secondary | ICD-10-CM

## 2016-05-03 ENCOUNTER — Other Ambulatory Visit: Payer: Self-pay | Admitting: Physician Assistant

## 2016-05-03 DIAGNOSIS — Z1231 Encounter for screening mammogram for malignant neoplasm of breast: Secondary | ICD-10-CM

## 2016-05-10 ENCOUNTER — Ambulatory Visit
Admission: RE | Admit: 2016-05-10 | Discharge: 2016-05-10 | Disposition: A | Discharge status: Home / Self Care | Payer: BC Managed Care – PPO | Source: Ambulatory Visit | Attending: Physician Assistant | Admitting: Physician Assistant

## 2016-05-10 DIAGNOSIS — Z1231 Encounter for screening mammogram for malignant neoplasm of breast: Secondary | ICD-10-CM

## 2017-06-21 ENCOUNTER — Other Ambulatory Visit: Payer: Self-pay | Admitting: Physician Assistant

## 2017-06-21 DIAGNOSIS — Z1231 Encounter for screening mammogram for malignant neoplasm of breast: Secondary | ICD-10-CM

## 2017-06-24 ENCOUNTER — Ambulatory Visit: Payer: BC Managed Care – PPO

## 2017-06-25 ENCOUNTER — Ambulatory Visit
Admission: RE | Admit: 2017-06-25 | Discharge: 2017-06-25 | Disposition: A | Discharge status: Home / Self Care | Payer: BC Managed Care – PPO | Source: Ambulatory Visit | Attending: Physician Assistant | Admitting: Physician Assistant

## 2017-06-25 DIAGNOSIS — Z1231 Encounter for screening mammogram for malignant neoplasm of breast: Secondary | ICD-10-CM

## 2018-08-23 ENCOUNTER — Other Ambulatory Visit: Payer: Self-pay

## 2018-08-23 DIAGNOSIS — Z20822 Contact with and (suspected) exposure to covid-19: Secondary | ICD-10-CM

## 2018-08-24 LAB — NOVEL CORONAVIRUS, NAA: SARS-CoV-2, NAA: NOT DETECTED

## 2018-10-07 ENCOUNTER — Other Ambulatory Visit: Payer: Self-pay | Admitting: Physician Assistant

## 2018-10-07 DIAGNOSIS — Z1231 Encounter for screening mammogram for malignant neoplasm of breast: Secondary | ICD-10-CM

## 2018-11-24 ENCOUNTER — Other Ambulatory Visit: Payer: Self-pay

## 2018-11-24 ENCOUNTER — Ambulatory Visit
Admission: RE | Admit: 2018-11-24 | Discharge: 2018-11-24 | Disposition: A | Discharge status: Home / Self Care | Payer: BC Managed Care – PPO | Source: Ambulatory Visit | Attending: Physician Assistant | Admitting: Physician Assistant

## 2018-11-24 DIAGNOSIS — Z1231 Encounter for screening mammogram for malignant neoplasm of breast: Secondary | ICD-10-CM

## 2019-07-14 ENCOUNTER — Other Ambulatory Visit: Payer: Self-pay | Admitting: Physician Assistant

## 2019-07-14 DIAGNOSIS — E2839 Other primary ovarian failure: Secondary | ICD-10-CM

## 2019-08-03 ENCOUNTER — Other Ambulatory Visit: Payer: Self-pay | Admitting: Physician Assistant

## 2019-08-03 DIAGNOSIS — Z1231 Encounter for screening mammogram for malignant neoplasm of breast: Secondary | ICD-10-CM

## 2019-11-30 ENCOUNTER — Inpatient Hospital Stay: Admission: RE | Admit: 2019-11-30 | Payer: BC Managed Care – PPO | Source: Ambulatory Visit

## 2019-11-30 ENCOUNTER — Ambulatory Visit: Payer: BC Managed Care – PPO

## 2020-01-11 ENCOUNTER — Ambulatory Visit: Payer: BC Managed Care – PPO

## 2020-02-09 ENCOUNTER — Emergency Department (HOSPITAL_COMMUNITY): Payer: BC Managed Care – PPO

## 2020-02-09 ENCOUNTER — Inpatient Hospital Stay (HOSPITAL_COMMUNITY)
Admission: EM | Admit: 2020-02-09 | Discharge: 2020-02-12 | DRG: 872 | Disposition: A | Discharge status: Home / Self Care | Payer: BC Managed Care – PPO | Attending: Internal Medicine | Admitting: Internal Medicine

## 2020-02-09 ENCOUNTER — Other Ambulatory Visit: Payer: Self-pay

## 2020-02-09 ENCOUNTER — Encounter (HOSPITAL_COMMUNITY): Payer: Self-pay

## 2020-02-09 DIAGNOSIS — Z888 Allergy status to other drugs, medicaments and biological substances status: Secondary | ICD-10-CM | POA: Diagnosis not present

## 2020-02-09 DIAGNOSIS — Z83438 Family history of other disorder of lipoprotein metabolism and other lipidemia: Secondary | ICD-10-CM

## 2020-02-09 DIAGNOSIS — Z6829 Body mass index (BMI) 29.0-29.9, adult: Secondary | ICD-10-CM | POA: Diagnosis not present

## 2020-02-09 DIAGNOSIS — F32A Depression, unspecified: Secondary | ICD-10-CM | POA: Diagnosis present

## 2020-02-09 DIAGNOSIS — E872 Acidosis: Secondary | ICD-10-CM | POA: Diagnosis present

## 2020-02-09 DIAGNOSIS — N179 Acute kidney failure, unspecified: Secondary | ICD-10-CM | POA: Diagnosis present

## 2020-02-09 DIAGNOSIS — Z833 Family history of diabetes mellitus: Secondary | ICD-10-CM | POA: Diagnosis not present

## 2020-02-09 DIAGNOSIS — Z8249 Family history of ischemic heart disease and other diseases of the circulatory system: Secondary | ICD-10-CM

## 2020-02-09 DIAGNOSIS — A4151 Sepsis due to Escherichia coli [E. coli]: Principal | ICD-10-CM | POA: Diagnosis present

## 2020-02-09 DIAGNOSIS — A419 Sepsis, unspecified organism: Secondary | ICD-10-CM

## 2020-02-09 DIAGNOSIS — N10 Acute tubulo-interstitial nephritis: Secondary | ICD-10-CM

## 2020-02-09 DIAGNOSIS — Z882 Allergy status to sulfonamides status: Secondary | ICD-10-CM | POA: Diagnosis not present

## 2020-02-09 DIAGNOSIS — R6520 Severe sepsis without septic shock: Secondary | ICD-10-CM | POA: Diagnosis not present

## 2020-02-09 DIAGNOSIS — E663 Overweight: Secondary | ICD-10-CM | POA: Diagnosis present

## 2020-02-09 DIAGNOSIS — F419 Anxiety disorder, unspecified: Secondary | ICD-10-CM | POA: Diagnosis present

## 2020-02-09 DIAGNOSIS — E785 Hyperlipidemia, unspecified: Secondary | ICD-10-CM | POA: Diagnosis present

## 2020-02-09 DIAGNOSIS — N12 Tubulo-interstitial nephritis, not specified as acute or chronic: Secondary | ICD-10-CM | POA: Diagnosis present

## 2020-02-09 DIAGNOSIS — N2882 Megaloureter: Secondary | ICD-10-CM | POA: Diagnosis present

## 2020-02-09 DIAGNOSIS — Z20822 Contact with and (suspected) exposure to covid-19: Secondary | ICD-10-CM | POA: Diagnosis present

## 2020-02-09 DIAGNOSIS — N1330 Unspecified hydronephrosis: Secondary | ICD-10-CM | POA: Diagnosis present

## 2020-02-09 DIAGNOSIS — N3000 Acute cystitis without hematuria: Secondary | ICD-10-CM

## 2020-02-09 DIAGNOSIS — Z79899 Other long term (current) drug therapy: Secondary | ICD-10-CM | POA: Diagnosis not present

## 2020-02-09 LAB — URINALYSIS, ROUTINE W REFLEX MICROSCOPIC
Bilirubin Urine: NEGATIVE
Glucose, UA: NEGATIVE mg/dL
Ketones, ur: NEGATIVE mg/dL
Nitrite: POSITIVE — AB
Protein, ur: 100 mg/dL — AB
Specific Gravity, Urine: 1.016 (ref 1.005–1.030)
WBC, UA: >50 WBC/hpf — ABNORMAL HIGH (ref 0–5)
pH: 5 (ref 5.0–8.0)

## 2020-02-09 LAB — CBC
HCT: 39 % (ref 36.0–46.0)
Hemoglobin: 13.1 g/dL (ref 12.0–15.0)
MCH: 31.3 pg (ref 26.0–34.0)
MCHC: 33.6 g/dL (ref 30.0–36.0)
MCV: 93.1 fL (ref 80.0–100.0)
Platelets: 144 10*3/uL — ABNORMAL LOW (ref 150–400)
RBC: 4.19 MIL/uL (ref 3.87–5.11)
RDW: 12 % (ref 11.5–15.5)
WBC: 10.6 10*3/uL — ABNORMAL HIGH (ref 4.0–10.5)
nRBC: 0 % (ref 0.0–0.2)

## 2020-02-09 LAB — COMPREHENSIVE METABOLIC PANEL
ALT: 35 U/L (ref 0–44)
AST: 29 U/L (ref 15–41)
Albumin: 4.5 g/dL (ref 3.5–5.0)
Alkaline Phosphatase: 59 U/L (ref 38–126)
Anion gap: 11 (ref 5–15)
BUN: 14 mg/dL (ref 6–20)
CO2: 23 mmol/L (ref 22–32)
Calcium: 9.2 mg/dL (ref 8.9–10.3)
Chloride: 99 mmol/L (ref 98–111)
Creatinine, Ser: 1.11 mg/dL — ABNORMAL HIGH (ref 0.44–1.00)
GFR, Estimated: 60 mL/min — ABNORMAL LOW (ref >60)
Glucose, Bld: 140 mg/dL — ABNORMAL HIGH (ref 70–99)
Potassium: 3.7 mmol/L (ref 3.5–5.1)
Sodium: 133 mmol/L — ABNORMAL LOW (ref 135–145)
Total Bilirubin: 0.8 mg/dL (ref 0.3–1.2)
Total Protein: 7.5 g/dL (ref 6.5–8.1)

## 2020-02-09 LAB — PROTIME-INR
INR: 1.3 — ABNORMAL HIGH (ref 0.8–1.2)
Prothrombin Time: 15.2 seconds (ref 11.4–15.2)

## 2020-02-09 LAB — LIPASE, BLOOD: Lipase: 32 U/L (ref 11–51)

## 2020-02-09 LAB — LACTIC ACID, PLASMA
Lactic Acid, Venous: 2.3 mmol/L (ref 0.5–1.9)
Lactic Acid, Venous: 2 mmol/L (ref 0.5–1.9)

## 2020-02-09 LAB — APTT: aPTT: 33 seconds (ref 24–36)

## 2020-02-09 MED ORDER — ONDANSETRON HCL 4 MG/2ML IJ SOLN
4.0000 mg | Freq: Once | INTRAMUSCULAR | Status: AC
  Administered 2020-02-09: 4 mg via INTRAVENOUS
  Filled 2020-02-09: qty 2

## 2020-02-09 MED ORDER — FLUOXETINE HCL 20 MG PO CAPS
20.0000 mg | ORAL_CAPSULE | Freq: Every day | ORAL | Status: DC
  Administered 2020-02-10 – 2020-02-12 (×3): 20 mg via ORAL
  Filled 2020-02-09 (×3): qty 1

## 2020-02-09 MED ORDER — SODIUM CHLORIDE 0.9 % IV SOLN
2.0000 g | INTRAVENOUS | Status: DC
  Administered 2020-02-10 – 2020-02-11 (×2): 2 g via INTRAVENOUS
  Filled 2020-02-09 (×2): qty 2

## 2020-02-09 MED ORDER — ACETAMINOPHEN 325 MG PO TABS
650.0000 mg | ORAL_TABLET | Freq: Four times a day (QID) | ORAL | Status: DC | PRN
  Administered 2020-02-10: 650 mg via ORAL
  Filled 2020-02-09: qty 2

## 2020-02-09 MED ORDER — ENOXAPARIN SODIUM 40 MG/0.4ML ~~LOC~~ SOLN
40.0000 mg | SUBCUTANEOUS | Status: DC
  Administered 2020-02-10 – 2020-02-11 (×2): 40 mg via SUBCUTANEOUS
  Filled 2020-02-09 (×3): qty 0.4

## 2020-02-09 MED ORDER — ACETAMINOPHEN 650 MG RE SUPP: 650.0000 mg | Freq: Four times a day (QID) | RECTAL | Status: DC | PRN

## 2020-02-09 MED ORDER — SODIUM CHLORIDE 0.9 % IV SOLN
2.0000 g | Freq: Once | INTRAVENOUS | Status: AC
  Administered 2020-02-09: 2 g via INTRAVENOUS
  Filled 2020-02-09: qty 20

## 2020-02-09 MED ORDER — LACTATED RINGERS IV SOLN: INTRAVENOUS | Status: AC

## 2020-02-09 MED ORDER — ACETAMINOPHEN 325 MG PO TABS
650.0000 mg | ORAL_TABLET | Freq: Once | ORAL | Status: AC | PRN
  Administered 2020-02-09: 650 mg via ORAL
  Filled 2020-02-09: qty 2

## 2020-02-09 MED ORDER — SODIUM CHLORIDE 0.9 % IV BOLUS
1000.0000 mL | Freq: Once | INTRAVENOUS | Status: AC
  Administered 2020-02-09: 1000 mL via INTRAVENOUS

## 2020-02-09 NOTE — Sepsis Progress Note (Signed)
Following for sepsis monitoring ?

## 2020-02-09 NOTE — ED Provider Notes (Signed)
[facility]-EMERGENCY DEPT Provider Note   CSN: [id number] Arrival date & time: 02/09/20  1514     History Chief Complaint  Patient presents with  . Abdominal Pain  . Fever  . Dysuria  . Emesis    Christie Lara is a 55 y.o. female.  The history is provided by the patient.  Abdominal Pain Pain location:  Generalized (had some right flank pain earlier in day and some lower abdominal pain, but having pain with urination) Pain quality: aching   Pain radiates to:  Does not radiate Pain severity:  Mild Onset quality:  Gradual Timing:  Intermittent Progression:  Waxing and waning Chronicity:  New Context comment:  UTI symptoms with fever and chills and n/v. Patient with no hx of kidney stones. Relieved by:  Nothing Worsened by:  Nothing Associated symptoms: dysuria, fever, nausea and vomiting   Associated symptoms: no chest pain, no chills, no cough, no diarrhea, no hematuria, no shortness of breath and no sore throat   Risk factors: has not had multiple surgeries   Fever Associated symptoms: dysuria, nausea and vomiting   Associated symptoms: no chest pain, no chills, no cough, no diarrhea, no ear pain, no rash and no sore throat   Dysuria Associated symptoms: abdominal pain, fever, nausea and vomiting   Emesis Associated symptoms: abdominal pain and fever   Associated symptoms: no arthralgias, no chills, no cough, no diarrhea and no sore throat        History reviewed. No pertinent past medical history.  Patient Active Problem List   Diagnosis Date Noted  . ALLERGIC RHINITIS 12/15/2009    Past Surgical History:  Procedure Laterality Date  . HERNIA REPAIR    . WISDOM TOOTH EXTRACTION       OB History    Gravida  3   Para  2   Term      Preterm      AB      Living  2     SAB      IAB      Ectopic      Multiple      Live Births              Family History  Problem Relation Age of Onset  . Hyperlipidemia Mother    . Diabetes Father   . Hypertension Father   . Cancer Father     Social History   Tobacco Use  . Smoking status: Never Smoker  . Smokeless tobacco: Never Used  Vaping Use  . Vaping Use: Never used  Substance Use Topics  . Alcohol use: Yes    Comment: glass of wine at dinner  . Drug use: No    Home Medications Prior to Admission medications   Medication Sig Start Date End Date Taking? Authorizing Provider  CRANBERRY PO Take 1 tablet by mouth at bedtime.   Yes [provider]  FLUoxetine (PROZAC) 20 MG tablet Take 1 tablet (20 mg total) by mouth daily. 10/29/11  Yes Dillard, Naima, MD  loratadine (CLARITIN) 10 MG tablet Take 10 mg by mouth daily as needed for allergies.   Yes [provider]    Allergies    Sulfamethoxazole-trimethoprim, Elemental sulfur, Sulfa antibiotics, and Erythromycin  Review of Systems   Review of Systems  Constitutional: Positive for fever. Negative for chills.  HENT: Negative for ear pain and sore throat.   Eyes: Negative for pain and visual disturbance.  Respiratory: Negative for cough and shortness  of breath.   Cardiovascular: Negative for chest pain and palpitations.  Gastrointestinal: Positive for abdominal pain, nausea and vomiting. Negative for diarrhea.  Genitourinary: Positive for dysuria. Negative for hematuria.  Musculoskeletal: Negative for arthralgias and back pain.  Skin: Negative for color change and rash.  Neurological: Negative for seizures and syncope.  All other systems reviewed and are negative.   Physical Exam Updated Vital Signs  ED Triage Vitals  Enc Vitals Group     BP 02/09/20 1532 112/63     Pulse Rate 02/09/20 1532 (!) 116     Resp 02/09/20 1532 14     Temp 02/09/20 1532 (!) 102.5 F (39.2 C)     Temp Source 02/09/20 1532 Oral     SpO2 02/09/20 1532 98 %     Weight 02/09/20 1533 180 lb (81.6 kg)     Height 02/09/20 1533 5\' 6"  (1.676 m)     Head Circumference --      Peak Flow --      Pain  Score 02/09/20 1533 5     Pain Loc --      Pain Edu? --      Excl. in GC? --     Physical Exam Vitals and nursing note reviewed.  Constitutional:      General: She is not in acute distress.    Appearance: She is well-developed and well-nourished. She is not ill-appearing.  HENT:     Head: Normocephalic and atraumatic.     Mouth/Throat:     Mouth: Mucous membranes are moist.  Eyes:     Extraocular Movements: Extraocular movements intact.     Conjunctiva/sclera: Conjunctivae normal.  Cardiovascular:     Rate and Rhythm: Normal rate and regular rhythm.     Heart sounds: Normal heart sounds. No murmur heard.   Pulmonary:     Effort: Pulmonary effort is normal. No respiratory distress.     Breath sounds: Normal breath sounds.  Abdominal:     General: Abdomen is flat. There is no distension.     Palpations: Abdomen is soft.     Tenderness: There is abdominal tenderness in the suprapubic area. There is right CVA tenderness (mild).  Musculoskeletal:        General: No edema.     Cervical back: Neck supple.  Skin:    General: Skin is warm and dry.     Capillary Refill: Capillary refill takes less than 2 seconds.  Neurological:     General: No focal deficit present.     Mental Status: She is alert.  Psychiatric:        Mood and Affect: Mood and affect and mood normal.     ED Results / Procedures / Treatments   Labs (all labs ordered are listed, but only abnormal results are displayed) Labs Reviewed  COMPREHENSIVE METABOLIC PANEL - Abnormal; Notable for the following components:      Result Value   Sodium 133 (*)    Glucose, Bld 140 (*)    Creatinine, Ser 1.11 (*)    GFR, Estimated 60 (*)    All other components within normal limits  CBC - Abnormal; Notable for the following components:   WBC 10.6 (*)    Platelets 144 (*)    All other components within normal limits  URINALYSIS, ROUTINE W REFLEX MICROSCOPIC - Abnormal; Notable for the following components:   Color,  Urine AMBER (*)    APPearance CLOUDY (*)    Hgb urine dipstick LARGE (*)  Protein, ur 100 (*)    Nitrite POSITIVE (*)    Leukocytes,Ua LARGE (*)    WBC, UA >50 (*)    Bacteria, UA MANY (*)    All other components within normal limits  LACTIC ACID, PLASMA - Abnormal; Notable for the following components:   Lactic Acid, Venous 2.3 (*)    All other components within normal limits  PROTIME-INR - Abnormal; Notable for the following components:   INR 1.3 (*)    All other components within normal limits  URINE CULTURE  CULTURE, BLOOD (ROUTINE X 2)  CULTURE, BLOOD (ROUTINE X 2)  RESP PANEL BY RT-PCR (FLU A&B, COVID) ARPGX2  LIPASE, BLOOD  APTT  LACTIC ACID, PLASMA    EKG EKG Interpretation  Date/Time:  Tuesday February 09 2020 20:22:11 EST Ventricular Rate:  96 PR Interval:    QRS Duration: 108 QT Interval:  328 QTC Calculation: 415 R Axis:   27 Text Interpretation: Sinus rhythm RSR' in V1 or V2, right VCD or RVH Confirmed by Virgina Norfolk [phone]) on 02/09/2020 8:27:24 PM   Radiology No results found.  Procedures .Critical Care Performed by: Virgina Norfolk, DO Authorized by: Virgina Norfolk, DO   Critical care provider statement:    Critical care time (minutes):  35   Critical care was necessary to treat or prevent imminent or life-threatening deterioration of the following conditions:  Sepsis   Critical care was time spent personally by me on the following activities:  Blood draw for specimens, development of treatment plan with patient or surrogate, discussions with primary provider, discussions with consultants, evaluation of patient's response to treatment, examination of patient, obtaining history from patient or surrogate, ordering and performing treatments and interventions, ordering and review of laboratory studies, ordering and review of radiographic studies, pulse oximetry, review of old charts and re-evaluation of patient's condition   I assumed direction of  critical care for this patient from another provider in my specialty: no       Medications Ordered in ED Medications  acetaminophen (TYLENOL) tablet 650 mg (650 mg Oral Given 02/09/20 1541)  sodium chloride 0.9 % bolus 1,000 mL (1,000 mLs Intravenous New Bag/Given 02/09/20 2045)  ondansetron (ZOFRAN) injection 4 mg (4 mg Intravenous Given 02/09/20 2045)  cefTRIAXone (ROCEPHIN) 2 g in sodium chloride 0.9 % 100 mL IVPB (0 g Intravenous Stopped 02/09/20 2118)    ED Course  I have reviewed the triage vital signs and the nursing notes.  Pertinent labs & imaging results that were available during my care of the patient were reviewed by me and considered in my medical decision making (see chart for details).    MDM Rules/Calculators/A&P                          Christie Lara is a 55 year old female with no significant medical history presents to the ED with abdominal pain, fever, nausea, pain with urination.  1 episode a vomiting this morning.  Patient febrile and tachycardic.  Sepsis order set has been placed after patient has been in the waiting room for a while with basic labs and urine done.  However urinalysis is already consistent with an infection.  White count is 10.6.  Fever and tachycardia have resolved with Tylenol in the waiting room as well.  Will check lactic acid and get blood cultures.  Will give fluid bolus, Zofran, Rocephin.  Will check a chest x-ray and get a CT scan of the abdomen pelvis to  look for any evidence of an infected kidney stone or other infectious process.   Lactic acid 2.3. CT scan showed extensive left perinephric stranding and free fluid with severe hydronephrosis and dilation with abrupt transition near the ureteropelvic junction. However no kidney stone, no lesion. Could be consistent with a sending urinary tract infection which is consistent with her urinalysis. Talked with Dr. Marlou Porch with urology who recommends aggressive treatment of UTI. She will need 2 weeks of  antibiotics and follow-up with urology outpatient. Likely will get renal ultrasound outpatient with urology. Will admit patient for further sepsis care.  This chart was dictated using voice recognition software.  Despite best efforts to proofread,  errors can occur which can change the documentation meaning.    Final Clinical Impression(s) / ED Diagnoses Final diagnoses:  Sepsis, due to unspecified organism, unspecified whether acute organ dysfunction present [facility])  Acute cystitis without hematuria    Rx / DC Orders ED Discharge Orders    None       Virgina Norfolk, DO 02/09/20 2253

## 2020-02-09 NOTE — H&P (Signed)
History and Physical    Christie Lara GYJ:[id number] DOB: 1965-02-13 DOA: 02/09/2020  PCP: Gordy Savers, MD (Inactive)  Patient coming from: Home.  Chief Complaint: Abdominal pain and dysuria.  HPI: Christie Lara is a 55 y.o. female with history of anxiety depression on fluoxetine presents to the ER after patient started having dysuria with abdominal discomfort over the last 48 hours and had one episode of nausea vomiting.  Pain is mostly in the suprapubic area.  Has been having subjective feeling of fever chills.  Denies any chest pain or shortness of breath.  ED Course: In the ER patient was febrile with temperature 102.5 F and tachycardic with lactic acid of 2.3 WBC of 10.1 Covid test is negative but UA is consistent with UTI.  CT scan of the abdomen shows left-sided hydronephrosis with perinephric stranding with dilatation of the ureter with abrupt transition point around the ureteropelvic junction.  ER physician discussed with on-call urologist Dr. Marlou Porch who advised antibiotics following blood cultures and to follow-up with urologist.  This time no definite intervention required since there was no obstructing stone.  Review of Systems: As per HPI, rest all negative.   History reviewed. No pertinent past medical history.  Past Surgical History:  Procedure Laterality Date  . HERNIA REPAIR    . WISDOM TOOTH EXTRACTION       reports that she has never smoked. She has never used smokeless tobacco. She reports current alcohol use. She reports that she does not use drugs.  Allergies  Allergen Reactions  . Sulfamethoxazole-Trimethoprim Nausea And Vomiting and Nausea Only    Other reaction(s): Abdominal Pain, Fever (intolerance)  . Elemental Sulfur   . Sulfa Antibiotics   . Erythromycin Nausea And Vomiting and Nausea Only    REACTION: nausea REACTION: nausea REACTION: nausea REACTION: nausea     Family History  Problem Relation Age of Onset  . Hyperlipidemia  Mother   . Diabetes Father   . Hypertension Father   . Cancer Father     Prior to Admission medications   Medication Sig Start Date End Date Taking? Authorizing Provider  CRANBERRY PO Take 1 tablet by mouth at bedtime.   Yes [provider]  FLUoxetine (PROZAC) 20 MG tablet Take 1 tablet (20 mg total) by mouth daily. 10/29/11  Yes Dillard, Naima, MD  loratadine (CLARITIN) 10 MG tablet Take 10 mg by mouth daily as needed for allergies.   Yes [provider]    Physical Exam: Constitutional: Moderately built and nourished. Vitals:   02/09/20 2100 02/09/20 2135 02/09/20 2200 02/09/20 2300  BP: (!) 110/98 111/66 108/64 118/66  Pulse: (!) 108 (!) 112 (!) 107 (!) 118  Resp: (!) 22 (!) 26 (!) 24 (!) 22  Temp:      TempSrc:      SpO2: 100% 98% 97% 99%  Weight:      Height:       Eyes: Anicteric no pallor. ENMT: No discharge from the ears eyes nose or mouth. Neck: No mass felt.  No neck rigidity. Respiratory: No rhonchi or crepitations. Cardiovascular: S1-S2 heard. Abdomen: Soft nontender bowel sounds present. Musculoskeletal: No edema. Skin: No rash. Neurologic: Alert awake oriented to time place and person.  Moves all extremities. Psychiatric: Appears normal.  Normal affect.   Labs on Admission: I have personally reviewed following labs and imaging studies  CBC: Recent Labs  Lab 02/09/20 1547  WBC 10.6*  HGB 13.1  HCT 39.0  MCV 93.1  PLT 144*  Basic Metabolic Panel: Recent Labs  Lab 02/09/20 1547  NA 133*  K 3.7  CL 99  CO2 23  GLUCOSE 140*  BUN 14  CREATININE 1.11*  CALCIUM 9.2   GFR: Estimated Creatinine Clearance: 63.8 mL/min (A) (by C-G formula based on SCr of 1.11 mg/dL (H)). Liver Function Tests: Recent Labs  Lab 02/09/20 1547  AST 29  ALT 35  ALKPHOS 59  BILITOT 0.8  PROT 7.5  ALBUMIN 4.5   Recent Labs  Lab 02/09/20 1547  LIPASE 32   No results for input(s): AMMONIA in the last 168 hours. Coagulation Profile: Recent  Labs  Lab 02/09/20 2042  INR 1.3*   Cardiac Enzymes: No results for input(s): CKTOTAL, CKMB, CKMBINDEX, TROPONINI in the last 168 hours. BNP (last 3 results) No results for input(s): PROBNP in the last 8760 hours. HbA1C: No results for input(s): HGBA1C in the last 72 hours. CBG: No results for input(s): GLUCAP in the last 168 hours. Lipid Profile: No results for input(s): CHOL, HDL, LDLCALC, TRIG, CHOLHDL, LDLDIRECT in the last 72 hours. Thyroid Function Tests: No results for input(s): TSH, T4TOTAL, FREET4, T3FREE, THYROIDAB in the last 72 hours. Anemia Panel: No results for input(s): VITAMINB12, FOLATE, FERRITIN, TIBC, IRON, RETICCTPCT in the last 72 hours. Urine analysis:    Component Value Date/Time   COLORURINE AMBER (A) 02/09/2020 1643   APPEARANCEUR CLOUDY (A) 02/09/2020 1643   LABSPEC 1.016 02/09/2020 1643   PHURINE 5.0 02/09/2020 1643   GLUCOSEU NEGATIVE 02/09/2020 1643   HGBUR LARGE (A) 02/09/2020 1643   BILIRUBINUR NEGATIVE 02/09/2020 1643   BILIRUBINUR Mod 10/01/2013 0847   KETONESUR NEGATIVE 02/09/2020 1643   PROTEINUR 100 (A) 02/09/2020 1643   UROBILINOGEN >=8.0 10/01/2013 0847   NITRITE POSITIVE (A) 02/09/2020 1643   LEUKOCYTESUR LARGE (A) 02/09/2020 1643   Sepsis Labs: @LABRCNTIP (procalcitonin:4,lacticidven:4) )No results found for this or any previous visit (from the past 240 hour(s)).   Radiological Exams on Admission: DG Chest Port 1 View  Result Date: 02/09/2020 CLINICAL DATA:  Fever EXAM: PORTABLE CHEST 1 VIEW COMPARISON:  None. FINDINGS: The heart size and mediastinal contours are within normal limits. Both lungs are clear. The visualized skeletal structures are unremarkable. IMPRESSION: No active disease. Electronically Signed   By: 04/08/2020 M.D.   On: 02/09/2020 20:40   CT Renal Stone Study  Result Date: 02/09/2020 CLINICAL DATA:  Lower abdominal pain, fever, nausea and dysuria since yesterday EXAM: CT ABDOMEN AND PELVIS WITHOUT CONTRAST  TECHNIQUE: Multidetector CT imaging of the abdomen and pelvis was performed following the standard protocol without IV contrast. COMPARISON:  None FINDINGS: Lower chest: Lung bases are clear. Normal heart size. No pericardial effusion. Hepatobiliary: No visible focal liver lesion with limitations of this unenhanced CT. Smooth liver surface contour. Normal liver attenuation. Normal gallbladder and biliary tree without visible calcified gallstone. Pancreas: No pancreatic ductal dilatation or surrounding inflammatory changes. Spleen: Normal in size. No concerning splenic lesions. Adrenals/Urinary Tract: Normal adrenal glands. Extensive left perinephric stranding and free fluid with severe hydroureteronephrosis to the level of an abrupt transition in the proximal near the ureteropelvic junction. No clear obstructive calculus is seen. No discernible obstructing lesion. Mild urothelial thickening is noted. Additional circumferential thickening of the urinary bladder is seen. No right urinary tract dilatation or perinephric stranding. No concerning focal renal lesion. Stomach/Bowel: Small sliding-type hiatal hernia. Distal stomach is unremarkable. Tiny air-filled duodenal diverticulum (2/40), no inflammation. Duodenum with a normal sweep across the midline abdomen. No small bowel thickening or  dilatation. Normal appendix in the right lower quadrant. No colonic dilatation or wall thickening. Vascular/Lymphatic: Atherosclerotic calcifications within the abdominal aorta and branch vessels. No aneurysm or ectasia. No enlarged abdominopelvic lymph nodes. Reproductive: Anteverted uterus. No concerning adnexal lesions are seen. Other: Extensive perinephric stranding with small amount of retroperitoneal free fluid additional small volume low-attenuation fluid is seen in the deep pelvis as well. No free air. No bowel containing hernias. Musculoskeletal: No acute osseous abnormality or suspicious osseous lesion. Multilevel  degenerative changes are present in the imaged portions of the spine. Features most pronounced at L4-5 with some more sclerotic Modic type endplate changes with Schmorl's node formations. Additional degenerative changes in the hips and pelvis. IMPRESSION: 1. Extensive left perinephric stranding and free fluid with severe hydronephrosis and dilatation to the level of an abrupt transition near the ureteropelvic junction. No obstructive calculus nor discernible obstructing lesion is seen. Mild urothelial thickening is noted with circumferential thickening of the urinary bladder wall with mild urothelial thickening, suggestive of a concomitant ascending tract infection well particularly given the patient's urinalysis. Consider emergent urologic consultation. 2. Small sliding-type hiatal hernia. 3. Anteverted uterus. 4. Aortic Atherosclerosis (ICD10-I70.0). These results were called by telephone at the time of interpretation on 02/09/2020 at 9:51 pm to provider ADAM CURATOLO , who verbally acknowledged these results. Electronically Signed   By: Kreg Shropshire M.D.   On: 02/09/2020 21:52    EKG: Independently reviewed.  Normal sinus rhythm.  Assessment/Plan Principal Problem:   Sepsis (HCC) Active Problems:   Hydronephrosis of left kidney   Acute pyelonephritis    1. Sepsis secondary to left-sided pyelonephritis with hydronephrosis -patient has been started on empiric antibiotics for pyelonephritis follow cultures lactic acid levels procalcitonin level continue with aggressive hydration.  ER physician had discussed with on-call nephrologist Dr. Marlou Porch who advised antibiotics for at least 2 weeks and follow-up with urologist to make sure the hydronephrosis is improved. 2. History of depression on fluoxetine. 3. Acute renal failure likely from vomiting and sepsis.  I think will improve with hydration.  Since patient has septic picture on presentation will need inpatient status.   DVT prophylaxis:  Lovenox. Code Status: Full code. Family Communication: Discussed with patient. Disposition Plan: Home. Consults called: ER physician discussed with on-call urologist. Admission status: Inpatient.   Eduard Clos MD [facility] Pager [phone].  If 7PM-7AM, please contact night-coverage [email or web address] Password [facility]  02/09/2020, 11:16 PM

## 2020-02-09 NOTE — ED Triage Notes (Signed)
Patient c/o lower abdominal pain, fever, nausea, dysuria since yesterday. Patient  States she vomited x 1 this AM.  Patient went to UC x 2 and was instructed to come to the ED.

## 2020-02-10 DIAGNOSIS — R6520 Severe sepsis without septic shock: Secondary | ICD-10-CM

## 2020-02-10 DIAGNOSIS — N179 Acute kidney failure, unspecified: Secondary | ICD-10-CM

## 2020-02-10 DIAGNOSIS — A4151 Sepsis due to Escherichia coli [E. coli]: Principal | ICD-10-CM

## 2020-02-10 LAB — BLOOD CULTURE ID PANEL (REFLEXED) - BCID2

## 2020-02-10 LAB — COMPREHENSIVE METABOLIC PANEL
ALT: 30 U/L (ref 0–44)
AST: 29 U/L (ref 15–41)
Albumin: 3.8 g/dL (ref 3.5–5.0)
Alkaline Phosphatase: 46 U/L (ref 38–126)
Anion gap: 10 (ref 5–15)
BUN: 15 mg/dL (ref 6–20)
CO2: 25 mmol/L (ref 22–32)
Calcium: 8.3 mg/dL — ABNORMAL LOW (ref 8.9–10.3)
Chloride: 100 mmol/L (ref 98–111)
Creatinine, Ser: 1.32 mg/dL — ABNORMAL HIGH (ref 0.44–1.00)
GFR, Estimated: 49 mL/min — ABNORMAL LOW (ref >60)
Glucose, Bld: 123 mg/dL — ABNORMAL HIGH (ref 70–99)
Potassium: 4.3 mmol/L (ref 3.5–5.1)
Sodium: 135 mmol/L (ref 135–145)
Total Bilirubin: 0.9 mg/dL (ref 0.3–1.2)
Total Protein: 6.7 g/dL (ref 6.5–8.1)

## 2020-02-10 LAB — CBC WITH DIFFERENTIAL/PLATELET
Abs Immature Granulocytes: 0.09 10*3/uL — ABNORMAL HIGH (ref 0.00–0.07)
Basophils Absolute: 0 10*3/uL (ref 0.0–0.1)
Basophils Relative: 0 %
Eosinophils Absolute: 0 10*3/uL (ref 0.0–0.5)
Eosinophils Relative: 0 %
HCT: 34.1 % — ABNORMAL LOW (ref 36.0–46.0)
Hemoglobin: 11.5 g/dL — ABNORMAL LOW (ref 12.0–15.0)
Immature Granulocytes: 1 %
Lymphocytes Relative: 3 %
Lymphs Abs: 0.4 10*3/uL — ABNORMAL LOW (ref 0.7–4.0)
MCH: 31.6 pg (ref 26.0–34.0)
MCHC: 33.7 g/dL (ref 30.0–36.0)
MCV: 93.7 fL (ref 80.0–100.0)
Monocytes Absolute: 0.7 10*3/uL (ref 0.1–1.0)
Monocytes Relative: 6 %
Neutro Abs: 10.5 10*3/uL — ABNORMAL HIGH (ref 1.7–7.7)
Neutrophils Relative %: 90 %
Platelets: 127 10*3/uL — ABNORMAL LOW (ref 150–400)
RBC: 3.64 MIL/uL — ABNORMAL LOW (ref 3.87–5.11)
RDW: 12.1 % (ref 11.5–15.5)
WBC: 11.7 10*3/uL — ABNORMAL HIGH (ref 4.0–10.5)
nRBC: 0 % (ref 0.0–0.2)

## 2020-02-10 LAB — PROCALCITONIN: Procalcitonin: 6.83 ng/mL

## 2020-02-10 LAB — RESP PANEL BY RT-PCR (FLU A&B, COVID) ARPGX2
Influenza A by PCR: NEGATIVE
Influenza B by PCR: NEGATIVE
SARS Coronavirus 2 by RT PCR: NEGATIVE

## 2020-02-10 LAB — CBC
HCT: 33.5 % — ABNORMAL LOW (ref 36.0–46.0)
Hemoglobin: 11.2 g/dL — ABNORMAL LOW (ref 12.0–15.0)
MCH: 31.5 pg (ref 26.0–34.0)
MCHC: 33.4 g/dL (ref 30.0–36.0)
MCV: 94.1 fL (ref 80.0–100.0)
Platelets: 130 10*3/uL — ABNORMAL LOW (ref 150–400)
RBC: 3.56 MIL/uL — ABNORMAL LOW (ref 3.87–5.11)
RDW: 12 % (ref 11.5–15.5)
WBC: 10.4 10*3/uL (ref 4.0–10.5)
nRBC: 0 % (ref 0.0–0.2)

## 2020-02-10 LAB — CREATININE, SERUM
Creatinine, Ser: 1.19 mg/dL — ABNORMAL HIGH (ref 0.44–1.00)
GFR, Estimated: 55 mL/min — ABNORMAL LOW (ref >60)

## 2020-02-10 LAB — HIV ANTIBODY (ROUTINE TESTING W REFLEX): HIV Screen 4th Generation wRfx: NONREACTIVE

## 2020-02-10 LAB — LACTIC ACID, PLASMA: Lactic Acid, Venous: 1.7 mmol/L (ref 0.5–1.9)

## 2020-02-10 MED ORDER — SODIUM CHLORIDE 0.9 % IV SOLN
INTRAVENOUS | Status: DC | PRN
  Administered 2020-02-10 – 2020-02-11 (×2): 250 mL via INTRAVENOUS

## 2020-02-10 NOTE — Progress Notes (Signed)
PROGRESS NOTE    Christie Lara  ZOX:[id number] DOB: Sep 10, 1965 DOA: 02/09/2020 PCP: Gordy Savers, MD (Inactive)   Chief Complaint  Patient presents with  . Abdominal Pain  . Fever  . Dysuria  . Emesis    Brief Narrative: 55 year old female with history of anxiety/depression presented to the ED with dysuria abdominal discomfort x48 hours along with nausea vomiting, suprapubic area pain. She was seen in the ED found to have fever tachycardia lactic acidosis UA abnormal consistent with UTI,CT scan of the abdomen shows left-sided hydronephrosis with perinephric stranding with dilatation of the ureter with abrupt transition point around the ureteropelvic junction.  ER physician discussed with on-call urologist Dr. Marlou Porch who advised antibiotics following blood cultures and to follow-up with urologist.  This time no definite intervention required since there was no obstructing stone.  Subjective:  C/o frequency no more burning and no more nausea, hr is improved in 90s from 102s. Husband at bedside  no chest pain sob,nausea or vomiting  Assessment & Plan:  E coli Sepsis POA due to left-sided pyelonephritis with hydronephrosis: Patient voiding well this morning.  Continue on ceftriaxone follow-up culture data.  WBC downtrending blood pressure stable tachycardia has resolved.  Urology Dr. Marlou Porch was consulted in the ED and has advised antibiotics x2 weeks and outpatient follow-up Recent Labs  Lab 02/09/20 1547 02/09/20 2042 02/09/20 2325 02/09/20 2347 02/10/20 0403  WBC 10.6*  --   --  10.4 11.7*  LATICACIDVEN  --  2.3* 2.0*  --  1.7  PROCALCITON  --   --   --  6.83  --    Hydronephrosis of left kidney: Voiding well monitor renal function.  Depression on fluoxetine.  AKI: baseline creat at 0.8-0.9 from 01/08/20. AKI as evident by increase in serum creatinine ? 0.3 within 48 hrs  secondary to sepsis/uti- cont ivd, bmp in am Recent Labs    02/09/20 1547 02/09/20 2347  02/10/20 0403  BUN 14  --  15  CREATININE 1.11* 1.19* 1.32*   HLD- [place] on hold per pcp 2/2 elevated lfts while on statins  Overweight with BMI 29, outpatient follow-up  Nutrition: Diet Order            Diet regular Room service appropriate? Yes; Fluid consistency: Thin  Diet effective now                 Pt's Body mass index is 29.05 kg/m.  DVT prophylaxis: enoxaparin (LOVENOX) injection 40 mg Start: 02/10/20 1000 Code Status:   Code Status: Full Code  Family Communication: plan of care discussed with patient at bedside.  Status is: Inpatient Remains inpatient appropriate because:IV treatments appropriate due to intensity of illness or inability to take PO and Inpatient level of care appropriate due to severity of illness  Dispo: The patient is from: Home              Anticipated d/c is to: Home              Anticipated d/c date is: 1-2 days              Patient currently is not medically stable to d/c.   Difficult to place patient No    Consultants:see note  Procedures:see note  Culture/Microbiology    Component Value Date/Time   SDES  02/09/2020 2042    BLOOD RIGHT ANTECUBITAL Performed at [facility], [address], [place], [place] [address]    SDES  02/09/2020 2042  BLOOD LEFT ANTECUBITAL Performed at [facility], [address], [place], [place] [address]    SPECREQUEST  02/09/2020 2042    BOTTLES DRAWN AEROBIC AND ANAEROBIC Blood Culture adequate volume Performed at [facility], [address] [place], [place] [address]    SPECREQUEST  02/09/2020 2042    BOTTLES DRAWN AEROBIC AND ANAEROBIC Blood Culture adequate volume Performed at [facility], [address] [place], [place] [address]    CULT  02/09/2020 2042    NO GROWTH < 12 HOURS Performed at [facility], [address], [place], [place] [address]    CULT  02/09/2020 2042    NO GROWTH < 12  HOURS Performed at [facility], [address] [place], [place] [address]    REPTSTATUS PENDING 02/09/2020 2042   REPTSTATUS PENDING 02/09/2020 2042    Other culture-see note  Medications: Scheduled Meds: . enoxaparin (LOVENOX) injection  40 mg Subcutaneous Q24H  . FLUoxetine  20 mg Oral Daily   Continuous Infusions: . cefTRIAXone (ROCEPHIN)  IV    . lactated ringers 150 mL/hr at 02/10/20 5784    Antimicrobials: Anti-infectives (From admission, onward)   Start     Dose/Rate Route Frequency Ordered Stop   02/10/20 1800  cefTRIAXone (ROCEPHIN) 2 g in sodium chloride 0.9 % 100 mL IVPB        2 g 200 mL/hr over 30 Minutes Intravenous Every 24 hours 02/09/20 2315     02/09/20 2015  cefTRIAXone (ROCEPHIN) 2 g in sodium chloride 0.9 % 100 mL IVPB        2 g 200 mL/hr over 30 Minutes Intravenous  Once 02/09/20 2010 02/09/20 2118     Objective: Vitals: Today's Vitals   02/09/20 2330 02/10/20 0030 02/10/20 0330 02/10/20 0935  BP: (!) 103/54 103/60 103/60 108/69  Pulse: (!) 110 (!) 108 (!) 106 93  Resp: (!) 25 (!) 23 (!) 21 18  Temp:      TempSrc:      SpO2: 96% 95% 96% 95%  Weight:      Height:      PainSc:       No intake or output data in the 24 hours ending 02/10/20 1006 Filed Weights   02/09/20 1533  Weight: 81.6 kg   Weight change:   Intake/Output from previous day: No intake/output data recorded. Intake/Output this shift: No intake/output data recorded. Filed Weights   02/09/20 1533  Weight: 81.6 kg    Examination: General exam: AAOx3 ,NAD, weak appearing. HEENT:Oral mucosa moist, Ear/Nose WNL grossly,dentition normal. Respiratory system: bilaterally clear,no wheezing or crackles,no use of accessory muscle, non tender. Cardiovascular system: S1 & S2 +, regular, No JVD. Gastrointestinal system: Abdomen soft, NT,ND, BS+.  Left flank tenderness present Nervous System:Alert, awake, moving extremities and grossly nonfocal Extremities: No edema, distal  peripheral pulses palpable.  Skin: No rashes,no icterus. MSK: Normal muscle bulk,tone, power   Data Reviewed: I have personally reviewed following labs and imaging studies CBC: Recent Labs  Lab 02/09/20 1547 02/09/20 2347 02/10/20 0403  WBC 10.6* 10.4 11.7*  NEUTROABS  --   --  10.5*  HGB 13.1 11.2* 11.5*  HCT 39.0 33.5* 34.1*  MCV 93.1 94.1 93.7  PLT 144* 130* 127*   Basic Metabolic Panel: Recent Labs  Lab 02/09/20 1547 02/09/20 2347 02/10/20 0403  NA 133*  --  135  K 3.7  --  4.3  CL 99  --  100  CO2  23  --  25  GLUCOSE 140*  --  123*  BUN 14  --  15  CREATININE 1.11* 1.19* 1.32*  CALCIUM 9.2  --  8.3*   GFR: Estimated Creatinine Clearance: 53.7 mL/min (A) (by C-G formula based on SCr of 1.32 mg/dL (H)). Liver Function Tests: Recent Labs  Lab 02/09/20 1547 02/10/20 0403  AST 29 29  ALT 35 30  ALKPHOS 59 46  BILITOT 0.8 0.9  PROT 7.5 6.7  ALBUMIN 4.5 3.8   Recent Labs  Lab 02/09/20 1547  LIPASE 32   No results for input(s): AMMONIA in the last 168 hours. Coagulation Profile: Recent Labs  Lab 02/09/20 2042  INR 1.3*   Cardiac Enzymes: No results for input(s): CKTOTAL, CKMB, CKMBINDEX, TROPONINI in the last 168 hours. BNP (last 3 results) No results for input(s): PROBNP in the last 8760 hours. HbA1C: No results for input(s): HGBA1C in the last 72 hours. CBG: No results for input(s): GLUCAP in the last 168 hours. Lipid Profile: No results for input(s): CHOL, HDL, LDLCALC, TRIG, CHOLHDL, LDLDIRECT in the last 72 hours. Thyroid Function Tests: No results for input(s): TSH, T4TOTAL, FREET4, T3FREE, THYROIDAB in the last 72 hours. Anemia Panel: No results for input(s): VITAMINB12, FOLATE, FERRITIN, TIBC, IRON, RETICCTPCT in the last 72 hours. Sepsis Labs: Recent Labs  Lab 02/09/20 2042 02/09/20 2325 02/09/20 2347 02/10/20 0403  PROCALCITON  --   --  6.83  --   LATICACIDVEN 2.3* 2.0*  --  1.7    Recent Results (from the past 240 hour(s))   Blood Culture (routine x 2)     Status: None (Preliminary result)   Collection Time: 02/09/20  8:42 PM   Specimen: BLOOD  Result Value Ref Range Status   Specimen Description   Final    BLOOD RIGHT ANTECUBITAL Performed at [facility], [address], [place], [place] [address]    Special Requests   Final    BOTTLES DRAWN AEROBIC AND ANAEROBIC Blood Culture adequate volume Performed at [facility], [address] [place], [place] [address]    Culture   Final    NO GROWTH < 12 HOURS Performed at [facility], [address], [place], [place] [address]    Report Status PENDING  Incomplete  Blood Culture (routine x 2)     Status: None (Preliminary result)   Collection Time: 02/09/20  8:42 PM   Specimen: BLOOD  Result Value Ref Range Status   Specimen Description   Final    BLOOD LEFT ANTECUBITAL Performed at [facility], [address], [place], [place] [address]    Special Requests   Final    BOTTLES DRAWN AEROBIC AND ANAEROBIC Blood Culture adequate volume Performed at [facility], [address] [place], [place] [address]    Culture   Final    NO GROWTH < 12 HOURS Performed at [facility], [address] [place], [place] [address]    Report Status PENDING  Incomplete  Resp Panel by RT-PCR (Flu A&B, Covid) Nasopharyngeal Swab     Status: None   Collection Time: 02/09/20 10:07 PM   Specimen: Nasopharyngeal Swab; Nasopharyngeal(NP) swabs in vial transport medium  Result Value Ref Range Status   SARS Coronavirus 2 by RT PCR NEGATIVE NEGATIVE Final    Comment: (NOTE) SARS-CoV-2 target nucleic acids are NOT DETECTED.  The SARS-CoV-2 RNA is generally detectable in upper respiratory specimens during the acute phase  of infection. The lowest concentration of SARS-CoV-2 viral copies this assay can detect is 138 copies/mL. A negative result does not preclude SARS-Cov-2 infection and  should not be used as the sole basis for treatment or other patient management decisions. A negative result may occur with  improper specimen collection/handling, submission of specimen other than nasopharyngeal swab, presence of viral mutation(s) within the areas targeted by this assay, and inadequate number of viral copies(<138 copies/mL). A negative result must be combined with clinical observations, patient history, and epidemiological information. The expected result is Negative.  Fact Sheet for Patients:  BloggerCourse.com  Fact Sheet for Healthcare Providers:  SeriousBroker.it  This test is no t yet approved or cleared by the [place] FDA and  has been authorized for detection and/or diagnosis of SARS-CoV-2 by FDA under an Emergency Use Authorization (EUA). This EUA will remain  in effect (meaning this test can be used) for the duration of the COVID-19 declaration under Section 564(b)(1) of the Act, 21 U.S.C.section 360bbb-3(b)(1), unless the authorization is terminated  or revoked sooner.       Influenza A by PCR NEGATIVE NEGATIVE Final   Influenza B by PCR NEGATIVE NEGATIVE Final    Comment: (NOTE) The Xpert Xpress SARS-CoV-2/FLU/RSV plus assay is intended as an aid in the diagnosis of influenza from Nasopharyngeal swab specimens and should not be used as a sole basis for treatment. Nasal washings and aspirates are unacceptable for Xpert Xpress SARS-CoV-2/FLU/RSV testing.  Fact Sheet for Patients: BloggerCourse.com  Fact Sheet for Healthcare Providers: SeriousBroker.it  This test is not yet approved or cleared by the [place] FDA and has been authorized for detection and/or diagnosis of SARS-CoV-2 by FDA under an Emergency Use Authorization (EUA). This EUA will remain in effect (meaning this test can be used) for the duration of the COVID-19 declaration  under Section 564(b)(1) of the Act, 21 U.S.C. section 360bbb-3(b)(1), unless the authorization is terminated or revoked.  Performed at [facility], [address], [place], [place] [address]      Radiology Studies: [facility] Chest Port 1 View  Result Date: 02/09/2020 CLINICAL DATA:  Fever EXAM: PORTABLE CHEST 1 VIEW COMPARISON:  None. FINDINGS: The heart size and mediastinal contours are within normal limits. Both lungs are clear. The visualized skeletal structures are unremarkable. IMPRESSION: No active disease. Electronically Signed   By: Deatra Robinson M.D.   On: 02/09/2020 20:40   CT Renal Stone Study  Result Date: 02/09/2020 CLINICAL DATA:  Lower abdominal pain, fever, nausea and dysuria since yesterday EXAM: CT ABDOMEN AND PELVIS WITHOUT CONTRAST TECHNIQUE: Multidetector CT imaging of the abdomen and pelvis was performed following the standard protocol without IV contrast. COMPARISON:  None FINDINGS: Lower chest: Lung bases are clear. Normal heart size. No pericardial effusion. Hepatobiliary: No visible focal liver lesion with limitations of this unenhanced CT. Smooth liver surface contour. Normal liver attenuation. Normal gallbladder and biliary tree without visible calcified gallstone. Pancreas: No pancreatic ductal dilatation or surrounding inflammatory changes. Spleen: Normal in size. No concerning splenic lesions. Adrenals/Urinary Tract: Normal adrenal glands. Extensive left perinephric stranding and free fluid with severe hydroureteronephrosis to the level of an abrupt transition in the proximal near the ureteropelvic junction. No clear obstructive calculus is seen. No discernible obstructing lesion. Mild urothelial thickening is noted. Additional circumferential thickening of the urinary bladder is seen. No right urinary tract dilatation or perinephric stranding. No concerning focal renal lesion. Stomach/Bowel: Small sliding-type hiatal hernia. Distal stomach is unremarkable.  Tiny air-filled  duodenal diverticulum (2/40), no inflammation. Duodenum with a normal sweep across the midline abdomen. No small bowel thickening or dilatation. Normal appendix in the right lower quadrant. No colonic dilatation or wall thickening. Vascular/Lymphatic: Atherosclerotic calcifications within the abdominal aorta and branch vessels. No aneurysm or ectasia. No enlarged abdominopelvic lymph nodes. Reproductive: Anteverted uterus. No concerning adnexal lesions are seen. Other: Extensive perinephric stranding with small amount of retroperitoneal free fluid additional small volume low-attenuation fluid is seen in the deep pelvis as well. No free air. No bowel containing hernias. Musculoskeletal: No acute osseous abnormality or suspicious osseous lesion. Multilevel degenerative changes are present in the imaged portions of the spine. Features most pronounced at L4-5 with some more sclerotic Modic type endplate changes with Schmorl's node formations. Additional degenerative changes in the hips and pelvis. IMPRESSION: 1. Extensive left perinephric stranding and free fluid with severe hydronephrosis and dilatation to the level of an abrupt transition near the ureteropelvic junction. No obstructive calculus nor discernible obstructing lesion is seen. Mild urothelial thickening is noted with circumferential thickening of the urinary bladder wall with mild urothelial thickening, suggestive of a concomitant ascending tract infection well particularly given the patient's urinalysis. Consider emergent urologic consultation. 2. Small sliding-type hiatal hernia. 3. Anteverted uterus. 4. Aortic Atherosclerosis (ICD10-I70.0). These results were called by telephone at the time of interpretation on 02/09/2020 at 9:51 pm to provider ADAM CURATOLO , who verbally acknowledged these results. Electronically Signed   By: Kreg Shropshire M.D.   On: 02/09/2020 21:52    LOS: 1 day   Lanae Boast, MD [facility]  02/10/2020, 10:06  AM

## 2020-02-10 NOTE — ED Notes (Signed)
Pt given meal tray.

## 2020-02-10 NOTE — ED Notes (Signed)
Pt resting in stretcher, offers no complaints at this time.  

## 2020-02-10 NOTE — ED Notes (Signed)
Pt offered warm blankets, nutrition, repositioning in bed and restroom assistance.

## 2020-02-10 NOTE — ED Notes (Signed)
Called report for pt transfer, bedside report requested.

## 2020-02-10 NOTE — Progress Notes (Signed)
PHARMACY - PHYSICIAN COMMUNICATION CRITICAL VALUE ALERT - BLOOD CULTURE IDENTIFICATION (BCID)  Christie Lara is an 55 y.o. female who presented to [facility] on 02/09/2020 with a chief complaint of UTI, found to have L-sided pyelo and hydronephrosis  Assessment:  3/4 BCx bottles with E coli (c/w urinary source)  Name of physician (or Provider) Contacted: Kc  Current antibiotics: Rocephin 2g q24  Changes to prescribed antibiotics recommended:  Patient is on recommended antibiotics - No changes needed   Results for orders placed or performed during the hospital encounter of 02/09/20  Blood Culture ID Panel (Reflexed) (Collected: 02/09/2020  8:42 PM)  Result Value Ref Range   Enterococcus faecalis NOT DETECTED NOT DETECTED   Enterococcus Faecium NOT DETECTED NOT DETECTED   Listeria monocytogenes NOT DETECTED NOT DETECTED   Staphylococcus species NOT DETECTED NOT DETECTED   Staphylococcus aureus (BCID) NOT DETECTED NOT DETECTED   Staphylococcus epidermidis NOT DETECTED NOT DETECTED   Staphylococcus lugdunensis NOT DETECTED NOT DETECTED   Streptococcus species NOT DETECTED NOT DETECTED   Streptococcus agalactiae NOT DETECTED NOT DETECTED   Streptococcus pneumoniae NOT DETECTED NOT DETECTED   Streptococcus pyogenes NOT DETECTED NOT DETECTED   A.calcoaceticus-baumannii NOT DETECTED NOT DETECTED   Bacteroides fragilis NOT DETECTED NOT DETECTED   Enterobacterales DETECTED (A) NOT DETECTED   Enterobacter cloacae complex NOT DETECTED NOT DETECTED   Escherichia coli DETECTED (A) NOT DETECTED   Klebsiella aerogenes NOT DETECTED NOT DETECTED   Klebsiella oxytoca NOT DETECTED NOT DETECTED   Klebsiella pneumoniae NOT DETECTED NOT DETECTED   Proteus species NOT DETECTED NOT DETECTED   Salmonella species NOT DETECTED NOT DETECTED   Serratia marcescens NOT DETECTED NOT DETECTED   Haemophilus influenzae NOT DETECTED NOT DETECTED   Neisseria meningitidis NOT DETECTED NOT DETECTED   Pseudomonas  aeruginosa NOT DETECTED NOT DETECTED   Stenotrophomonas maltophilia NOT DETECTED NOT DETECTED   Candida albicans NOT DETECTED NOT DETECTED   Candida auris NOT DETECTED NOT DETECTED   Candida glabrata NOT DETECTED NOT DETECTED   Candida krusei NOT DETECTED NOT DETECTED   Candida parapsilosis NOT DETECTED NOT DETECTED   Candida tropicalis NOT DETECTED NOT DETECTED   Cryptococcus neoformans/gattii NOT DETECTED NOT DETECTED   CTX-M ESBL NOT DETECTED NOT DETECTED   Carbapenem resistance IMP NOT DETECTED NOT DETECTED   Carbapenem resistance KPC NOT DETECTED NOT DETECTED   Carbapenem resistance NDM NOT DETECTED NOT DETECTED   Carbapenem resist OXA 48 LIKE NOT DETECTED NOT DETECTED   Carbapenem resistance VIM NOT DETECTED NOT DETECTED    Christie Lara A 02/10/2020  12:27 PM

## 2020-02-10 NOTE — Plan of Care (Signed)

## 2020-02-10 NOTE — Sepsis Progress Note (Signed)
Sepsis monitoring complete 

## 2020-02-11 LAB — CBC
HCT: 30.9 % — ABNORMAL LOW (ref 36.0–46.0)
Hemoglobin: 10 g/dL — ABNORMAL LOW (ref 12.0–15.0)
MCH: 30.9 pg (ref 26.0–34.0)
MCHC: 32.4 g/dL (ref 30.0–36.0)
MCV: 95.4 fL (ref 80.0–100.0)
Platelets: 104 10*3/uL — ABNORMAL LOW (ref 150–400)
RBC: 3.24 MIL/uL — ABNORMAL LOW (ref 3.87–5.11)
RDW: 12.1 % (ref 11.5–15.5)
WBC: 6.1 10*3/uL (ref 4.0–10.5)
nRBC: 0 % (ref 0.0–0.2)

## 2020-02-11 LAB — BASIC METABOLIC PANEL
Anion gap: 9 (ref 5–15)
BUN: 15 mg/dL (ref 6–20)
CO2: 24 mmol/L (ref 22–32)
Calcium: 8.2 mg/dL — ABNORMAL LOW (ref 8.9–10.3)
Chloride: 103 mmol/L (ref 98–111)
Creatinine, Ser: 1.18 mg/dL — ABNORMAL HIGH (ref 0.44–1.00)
GFR, Estimated: 56 mL/min — ABNORMAL LOW (ref >60)
Glucose, Bld: 121 mg/dL — ABNORMAL HIGH (ref 70–99)
Potassium: 3.5 mmol/L (ref 3.5–5.1)
Sodium: 136 mmol/L (ref 135–145)

## 2020-02-11 MED ORDER — POLYETHYLENE GLYCOL 3350 17 G PO PACK
17.0000 g | PACK | Freq: Once | ORAL | Status: AC
  Administered 2020-02-11: 17 g via ORAL
  Filled 2020-02-11: qty 1

## 2020-02-11 NOTE — Progress Notes (Signed)
PROGRESS NOTE    Christie Lara  ZOX:[id number] DOB: 18-Jul-1965 DOA: 02/09/2020 PCP: Gordy Savers, MD (Inactive)   Chief Complaint  Patient presents with  . Abdominal Pain  . Fever  . Dysuria  . Emesis    Brief Narrative: 55 year old female with history of anxiety/depression presented to the ED with dysuria abdominal discomfort x48 hours along with nausea vomiting, suprapubic area pain. She was seen in the ED found to have fever tachycardia lactic acidosis UA abnormal consistent with UTI,CT scan of the abdomen shows left-sided hydronephrosis with perinephric stranding with dilatation of the ureter with abrupt transition point around the ureteropelvic junction.  ER physician discussed with on-call urologist Dr. Marlou Porch who advised antibiotics following blood cultures and to follow-up with urologist.  This time no definite intervention required since there was no obstructing stone. She was admitted on iv antibiotics. Blood cx and urien cx came back + with E coli. Her AKI improved with ivf. At this time afebrile  Subjective: Patient feels much improved.  Afebrile.  Assessment & Plan:  E coli Sepsis POA due to left-sided pyelonephritis with hydronephrosis: Clinically improved leukocytosis resolved.  Called microbiology lab E. coli susceptibility will be back tomorrow morning.  Patient still has tenderness on the left side but improving.For now continue on ceftriaxone iv 2 gm.Urology Dr. Marlou Porch was consulted in the ED and has advised antibiotics x2 weeks and outpatient follow-up Recent Labs  Lab 02/09/20 1547 02/09/20 2042 02/09/20 2325 02/09/20 2347 02/10/20 0403 02/11/20 0537  WBC 10.6*  --   --  10.4 11.7* 6.1  LATICACIDVEN  --  2.3* 2.0*  --  1.7  --   PROCALCITON  --   --   --  6.83  --   --    Hydronephrosis of left kidney: Voiding well.  Outpatient urology follow-up.  Depression mood is stable, continue on fluoxetine.  AKI: baseline creat at 0.8-0.9 from 01/08/20.   AKI in the setting of sepsis.  AKI as evident by increase in serum creatinine ? 0.3 it has resolved.  Stop IV fluids. Recent Labs    02/09/20 1547 02/09/20 2347 02/10/20 0403 02/11/20 0537  BUN 14  --  15 15  CREATININE 1.11* 1.19* 1.32* 1.18*   HLD- crestor on hold per pcp 2/2 elevated lfts while on statins  Overweight with BMI 29, outpatient follow-up  Nutrition: Diet Order            Diet regular Room service appropriate? Yes; Fluid consistency: Thin  Diet effective now                 Pt's Body mass index is 29.05 kg/m.  DVT prophylaxis: enoxaparin (LOVENOX) injection 40 mg Start: 02/10/20 1000 Code Status:   Code Status: Full Code  Family Communication: plan of care discussed with patient at bedside.  Status is: Inpatient Remains inpatient appropriate because:IV treatments appropriate due to intensity of illness or inability to take PO and Inpatient level of care appropriate due to severity of illness  Dispo: The patient is from: Home              Anticipated d/c is to: Home              Anticipated d/c date is: Tomorrow once bacterial susceptibilities are back              Patient currently is not medically stable to d/c.   Difficult to place patient No    Consultants:see note  Procedures:see note  Culture/Microbiology    Component Value Date/Time   SDES  02/09/2020 2042    BLOOD RIGHT ANTECUBITAL Performed at [facility], [address], [place], [place] [address]    SDES  02/09/2020 2042    BLOOD LEFT ANTECUBITAL Performed at [facility], [address] [place], [place] [address]    SPECREQUEST  02/09/2020 2042    BOTTLES DRAWN AEROBIC AND ANAEROBIC Blood Culture adequate volume Performed at [facility], [address], [place], [place] [address]    SPECREQUEST  02/09/2020 2042    BOTTLES DRAWN AEROBIC AND ANAEROBIC Blood Culture adequate volume Performed at [facility], [address], [place], [place] [address]    CULT  02/09/2020 2042    GRAM NEGATIVE RODS IDENTIFICATION TO FOLLOW Performed at [facility], [address] [place], [place] [address]    CULT (A) 02/09/2020 2042    ESCHERICHIA COLI SUSCEPTIBILITIES TO FOLLOW Performed at [facility], [address] [place], [place] [address]    REPTSTATUS PENDING 02/09/2020 2042   REPTSTATUS PENDING 02/09/2020 2042    Other culture-see note  Medications: Scheduled Meds: . enoxaparin (LOVENOX) injection  40 mg Subcutaneous Q24H  . FLUoxetine  20 mg Oral Daily   Continuous Infusions: . sodium chloride Stopped (02/10/20 2053)  . cefTRIAXone (ROCEPHIN)  IV 2 g (02/10/20 2013)    Antimicrobials: Anti-infectives (From admission, onward)   Start     Dose/Rate Route Frequency Ordered Stop   02/10/20 1800  cefTRIAXone (ROCEPHIN) 2 g in sodium chloride 0.9 % 100 mL IVPB        2 g 200 mL/hr over 30 Minutes Intravenous Every 24 hours 02/09/20 2315     02/09/20 2015  cefTRIAXone (ROCEPHIN) 2 g in sodium chloride 0.9 % 100 mL IVPB        2 g 200 mL/hr over 30 Minutes Intravenous  Once 02/09/20 2010 02/09/20 2118     Objective: Vitals: Today's Vitals   02/11/20 0515 02/11/20 0800 02/11/20 1000 02/11/20 1224  BP: 116/63   119/71  Pulse: 86   89  Resp: 14   18  Temp: 98.7 F (37.1 C)   100 F (37.8 C)  TempSrc: Oral   Oral  SpO2: 93%   96%  Weight:      Height:      PainSc:  1  0-No pain     Intake/Output Summary (Last 24 hours) at 02/11/2020 1351 Last data filed at 02/11/2020 1000 Gross per 24 hour  Intake 1608.9 ml  Output --  Net 1608.9 ml   Filed Weights   02/09/20 1533  Weight: 81.6 kg   Weight change:   Intake/Output from previous day: 02/09 0701 - 02/10 0700 In: 1368.9 [P.O.:600; I.V.:668.9; IV Piggyback:100] Out: -  Intake/Output this shift: Total I/O In: 240 [P.O.:240] Out: -  Filed Weights   02/09/20 1533  Weight: 81.6 kg     Examination: General exam: AAOx3,NAD,weak appearing. HEENT:Oral mucosa moist, Ear/Nose WNL grossly, dentition normal. Respiratory system: bilaterally clear,no wheezing or crackles,no use of accessory muscle. Cardiovascular system: S1 & S2 +, No JVD,. Gastrointestinal system: Abdomen soft, NT,ND, BS+, mild left flank tenderness+. Nervous System:Alert, awake, moving extremities and grossly nonfocal Extremities: No edema, distal peripheral pulses palpable.  Skin: No rashes,no icterus. MSK: Normal muscle bulk,tone, power   Data Reviewed: I have personally reviewed following labs and imaging studies CBC: Recent Labs  Lab 02/09/20 1547 02/09/20 2347 02/10/20  0403 02/11/20 0537  WBC 10.6* 10.4 11.7* 6.1  NEUTROABS  --   --  10.5*  --   HGB 13.1 11.2* 11.5* 10.0*  HCT 39.0 33.5* 34.1* 30.9*  MCV 93.1 94.1 93.7 95.4  PLT 144* 130* 127* 104*   Basic Metabolic Panel: Recent Labs  Lab 02/09/20 1547 02/09/20 2347 02/10/20 0403 02/11/20 0537  NA 133*  --  135 136  K 3.7  --  4.3 3.5  CL 99  --  100 103  CO2 23  --  25 24  GLUCOSE 140*  --  123* 121*  BUN 14  --  15 15  CREATININE 1.11* 1.19* 1.32* 1.18*  CALCIUM 9.2  --  8.3* 8.2*   GFR: Estimated Creatinine Clearance: 60 mL/min (A) (by C-G formula based on SCr of 1.18 mg/dL (H)). Liver Function Tests: Recent Labs  Lab 02/09/20 1547 02/10/20 0403  AST 29 29  ALT 35 30  ALKPHOS 59 46  BILITOT 0.8 0.9  PROT 7.5 6.7  ALBUMIN 4.5 3.8   Recent Labs  Lab 02/09/20 1547  LIPASE 32   No results for input(s): AMMONIA in the last 168 hours. Coagulation Profile: Recent Labs  Lab 02/09/20 2042  INR 1.3*   Cardiac Enzymes: No results for input(s): CKTOTAL, CKMB, CKMBINDEX, TROPONINI in the last 168 hours. BNP (last 3 results) No results for input(s): PROBNP in the last 8760 hours. HbA1C: No results for input(s): HGBA1C in the last 72 hours. CBG: No results for input(s): GLUCAP in the last 168 hours. Lipid  Profile: No results for input(s): CHOL, HDL, LDLCALC, TRIG, CHOLHDL, LDLDIRECT in the last 72 hours. Thyroid Function Tests: No results for input(s): TSH, T4TOTAL, FREET4, T3FREE, THYROIDAB in the last 72 hours. Anemia Panel: No results for input(s): VITAMINB12, FOLATE, FERRITIN, TIBC, IRON, RETICCTPCT in the last 72 hours. Sepsis Labs: Recent Labs  Lab 02/09/20 2042 02/09/20 2325 02/09/20 2347 02/10/20 0403  PROCALCITON  --   --  6.83  --   LATICACIDVEN 2.3* 2.0*  --  1.7    Recent Results (from the past 240 hour(s))  Urine culture     Status: Abnormal (Preliminary result)   Collection Time: 02/09/20  8:10 PM   Specimen: Urine, Clean Catch  Result Value Ref Range Status   Specimen Description   Final    URINE, CLEAN CATCH Performed at [facility], [address], [place], [place] [address]    Special Requests   Final    NONE Performed at [facility], [address], [place], [place] [address]    Culture (A)  Final    >=100,000 COLONIES/mL ESCHERICHIA COLI SUSCEPTIBILITIES TO FOLLOW Performed at [facility], [address] [place], [place] [address]    Report Status PENDING  Incomplete  Blood Culture (routine x 2)     Status: None (Preliminary result)   Collection Time: 02/09/20  8:42 PM   Specimen: BLOOD  Result Value Ref Range Status   Specimen Description   Final    BLOOD RIGHT ANTECUBITAL Performed at [facility], [address] [place], [place] [address]    Special Requests   Final    BOTTLES DRAWN AEROBIC AND ANAEROBIC Blood Culture adequate volume Performed at [facility], [address] [place], [place] [address]    Culture  Setup Time   Final    GRAM NEGATIVE RODS AEROBIC BOTTLE ONLY CRITICAL VALUE NOTED.  VALUE IS CONSISTENT WITH PREVIOUSLY  REPORTED AND CALLED VALUE.    Culture   Final    GRAM NEGATIVE RODS IDENTIFICATION TO FOLLOW Performed at [facility], [address], [place], [place] [address]    Report Status PENDING  Incomplete  Blood Culture (routine x 2)     Status: Abnormal (Preliminary result)   Collection Time: 02/09/20  8:42 PM   Specimen: BLOOD  Result Value Ref Range Status   Specimen Description   Final    BLOOD LEFT ANTECUBITAL Performed at [facility], [address] [place], [place] [address]    Special Requests   Final    BOTTLES DRAWN AEROBIC AND ANAEROBIC Blood Culture adequate volume Performed at [facility], [address], [place], [place] [address]    Culture  Setup Time   Final    GRAM NEGATIVE RODS IN BOTH AEROBIC AND ANAEROBIC BOTTLES CRITICAL RESULT CALLED TO, READ BACK BY AND VERIFIED WITH: PHARMD N GOOGOVAC [id number] AT 1151 BY CM    Culture (A)  Final    ESCHERICHIA COLI SUSCEPTIBILITIES TO FOLLOW Performed at [facility], [address], [place], [place] [address]    Report Status PENDING  Incomplete  Blood Culture ID Panel (Reflexed)     Status: Abnormal   Collection Time: 02/09/20  8:42 PM  Result Value Ref Range Status   Enterococcus faecalis NOT DETECTED NOT DETECTED Final   Enterococcus Faecium NOT DETECTED NOT DETECTED Final   Listeria monocytogenes NOT DETECTED NOT DETECTED Final   Staphylococcus species NOT DETECTED NOT DETECTED Final   Staphylococcus aureus (BCID) NOT DETECTED NOT DETECTED Final   Staphylococcus epidermidis NOT DETECTED NOT DETECTED Final   Staphylococcus lugdunensis NOT DETECTED NOT DETECTED Final   Streptococcus species NOT DETECTED NOT DETECTED Final   Streptococcus agalactiae NOT DETECTED NOT DETECTED Final   Streptococcus pneumoniae NOT DETECTED NOT DETECTED Final   Streptococcus pyogenes NOT DETECTED NOT DETECTED Final   A.calcoaceticus-baumannii NOT DETECTED NOT DETECTED Final   Bacteroides fragilis NOT DETECTED NOT DETECTED Final   Enterobacterales DETECTED (A) NOT DETECTED Final    Comment:  Enterobacterales represent a large order of gram negative bacteria, not a single organism. CRITICAL RESULT CALLED TO, READ BACK BY AND VERIFIED WITH: PHARMD N GOOGOVAC [id number] AT 1152 BY CM    Enterobacter cloacae complex NOT DETECTED NOT DETECTED Final   Escherichia coli DETECTED (A) NOT DETECTED Final    Comment: CRITICAL RESULT CALLED TO, READ BACK BY AND VERIFIED WITH: PHARMD N GOOGOVAC [id number] AT 1151 BY CM    Klebsiella aerogenes NOT DETECTED NOT DETECTED Final   Klebsiella oxytoca NOT DETECTED NOT DETECTED Final   Klebsiella pneumoniae NOT DETECTED NOT DETECTED Final   Proteus species NOT DETECTED NOT DETECTED Final   Salmonella species NOT DETECTED NOT DETECTED Final   Serratia marcescens NOT DETECTED NOT DETECTED Final   Haemophilus influenzae NOT DETECTED NOT DETECTED Final   Neisseria meningitidis NOT DETECTED NOT DETECTED Final   Pseudomonas aeruginosa NOT DETECTED NOT DETECTED Final   Stenotrophomonas maltophilia NOT DETECTED NOT DETECTED Final   Candida albicans NOT DETECTED NOT DETECTED Final   Candida auris NOT DETECTED NOT DETECTED Final   Candida glabrata NOT DETECTED NOT DETECTED Final   Candida krusei NOT DETECTED NOT DETECTED Final   Candida parapsilosis NOT DETECTED NOT DETECTED Final   Candida tropicalis NOT DETECTED NOT DETECTED Final   Cryptococcus neoformans/gattii NOT DETECTED NOT DETECTED Final   CTX-M ESBL NOT DETECTED NOT DETECTED  Final   Carbapenem resistance IMP NOT DETECTED NOT DETECTED Final   Carbapenem resistance KPC NOT DETECTED NOT DETECTED Final   Carbapenem resistance NDM NOT DETECTED NOT DETECTED Final   Carbapenem resist OXA 48 LIKE NOT DETECTED NOT DETECTED Final   Carbapenem resistance VIM NOT DETECTED NOT DETECTED Final    Comment: Performed at [facility], [address] [place], [place] [address]  Resp Panel by RT-PCR (Flu A&B, Covid) Nasopharyngeal Swab     Status: None   Collection Time: 02/09/20 10:07 PM   Specimen:  Nasopharyngeal Swab; Nasopharyngeal(NP) swabs in vial transport medium  Result Value Ref Range Status   SARS Coronavirus 2 by RT PCR NEGATIVE NEGATIVE Final    Comment: (NOTE) SARS-CoV-2 target nucleic acids are NOT DETECTED.  The SARS-CoV-2 RNA is generally detectable in upper respiratory specimens during the acute phase of infection. The lowest concentration of SARS-CoV-2 viral copies this assay can detect is 138 copies/mL. A negative result does not preclude SARS-Cov-2 infection and should not be used as the sole basis for treatment or other patient management decisions. A negative result may occur with  improper specimen collection/handling, submission of specimen other than nasopharyngeal swab, presence of viral mutation(s) within the areas targeted by this assay, and inadequate number of viral copies(<138 copies/mL). A negative result must be combined with clinical observations, patient history, and epidemiological information. The expected result is Negative.  Fact Sheet for Patients:  BloggerCourse.com  Fact Sheet for Healthcare Providers:  SeriousBroker.it  This test is no t yet approved or cleared by the [place] FDA and  has been authorized for detection and/or diagnosis of SARS-CoV-2 by FDA under an Emergency Use Authorization (EUA). This EUA will remain  in effect (meaning this test can be used) for the duration of the COVID-19 declaration under Section 564(b)(1) of the Act, 21 U.S.C.section 360bbb-3(b)(1), unless the authorization is terminated  or revoked sooner.       Influenza A by PCR NEGATIVE NEGATIVE Final   Influenza B by PCR NEGATIVE NEGATIVE Final    Comment: (NOTE) The Xpert Xpress SARS-CoV-2/FLU/RSV plus assay is intended as an aid in the diagnosis of influenza from Nasopharyngeal swab specimens and should not be used as a sole basis for treatment. Nasal washings and aspirates are unacceptable for  Xpert Xpress SARS-CoV-2/FLU/RSV testing.  Fact Sheet for Patients: BloggerCourse.com  Fact Sheet for Healthcare Providers: SeriousBroker.it  This test is not yet approved or cleared by the [place] FDA and has been authorized for detection and/or diagnosis of SARS-CoV-2 by FDA under an Emergency Use Authorization (EUA). This EUA will remain in effect (meaning this test can be used) for the duration of the COVID-19 declaration under Section 564(b)(1) of the Act, 21 U.S.C. section 360bbb-3(b)(1), unless the authorization is terminated or revoked.  Performed at [facility], [address] [place], [place] [address]      Radiology Studies: [facility] Chest Port 1 View  Result Date: 02/09/2020 CLINICAL DATA:  Fever EXAM: PORTABLE CHEST 1 VIEW COMPARISON:  None. FINDINGS: The heart size and mediastinal contours are within normal limits. Both lungs are clear. The visualized skeletal structures are unremarkable. IMPRESSION: No active disease. Electronically Signed   By: Deatra Robinson M.D.   On: 02/09/2020 20:40   CT Renal Stone Study  Result Date: 02/09/2020 CLINICAL DATA:  Lower abdominal pain, fever, nausea and dysuria since yesterday EXAM: CT ABDOMEN AND PELVIS WITHOUT CONTRAST TECHNIQUE: Multidetector CT imaging of the abdomen and pelvis  was performed following the standard protocol without IV contrast. COMPARISON:  None FINDINGS: Lower chest: Lung bases are clear. Normal heart size. No pericardial effusion. Hepatobiliary: No visible focal liver lesion with limitations of this unenhanced CT. Smooth liver surface contour. Normal liver attenuation. Normal gallbladder and biliary tree without visible calcified gallstone. Pancreas: No pancreatic ductal dilatation or surrounding inflammatory changes. Spleen: Normal in size. No concerning splenic lesions. Adrenals/Urinary Tract: Normal adrenal glands. Extensive left perinephric  stranding and free fluid with severe hydroureteronephrosis to the level of an abrupt transition in the proximal near the ureteropelvic junction. No clear obstructive calculus is seen. No discernible obstructing lesion. Mild urothelial thickening is noted. Additional circumferential thickening of the urinary bladder is seen. No right urinary tract dilatation or perinephric stranding. No concerning focal renal lesion. Stomach/Bowel: Small sliding-type hiatal hernia. Distal stomach is unremarkable. Tiny air-filled duodenal diverticulum (2/40), no inflammation. Duodenum with a normal sweep across the midline abdomen. No small bowel thickening or dilatation. Normal appendix in the right lower quadrant. No colonic dilatation or wall thickening. Vascular/Lymphatic: Atherosclerotic calcifications within the abdominal aorta and branch vessels. No aneurysm or ectasia. No enlarged abdominopelvic lymph nodes. Reproductive: Anteverted uterus. No concerning adnexal lesions are seen. Other: Extensive perinephric stranding with small amount of retroperitoneal free fluid additional small volume low-attenuation fluid is seen in the deep pelvis as well. No free air. No bowel containing hernias. Musculoskeletal: No acute osseous abnormality or suspicious osseous lesion. Multilevel degenerative changes are present in the imaged portions of the spine. Features most pronounced at L4-5 with some more sclerotic Modic type endplate changes with Schmorl's node formations. Additional degenerative changes in the hips and pelvis. IMPRESSION: 1. Extensive left perinephric stranding and free fluid with severe hydronephrosis and dilatation to the level of an abrupt transition near the ureteropelvic junction. No obstructive calculus nor discernible obstructing lesion is seen. Mild urothelial thickening is noted with circumferential thickening of the urinary bladder wall with mild urothelial thickening, suggestive of a concomitant ascending tract  infection well particularly given the patient's urinalysis. Consider emergent urologic consultation. 2. Small sliding-type hiatal hernia. 3. Anteverted uterus. 4. Aortic Atherosclerosis (ICD10-I70.0). These results were called by telephone at the time of interpretation on 02/09/2020 at 9:51 pm to provider ADAM CURATOLO , who verbally acknowledged these results. Electronically Signed   By: Kreg Shropshire M.D.   On: 02/09/2020 21:52    LOS: 2 days   Lanae Boast, MD [facility]  02/11/2020, 1:51 PM

## 2020-02-11 NOTE — Progress Notes (Signed)
Assumed care of the patient from off going RN. Condition stable. Cont with current plan of care

## 2020-02-12 LAB — BASIC METABOLIC PANEL
Anion gap: 9 (ref 5–15)
BUN: 15 mg/dL (ref 6–20)
CO2: 23 mmol/L (ref 22–32)
Calcium: 8.4 mg/dL — ABNORMAL LOW (ref 8.9–10.3)
Chloride: 104 mmol/L (ref 98–111)
Creatinine, Ser: 1.04 mg/dL — ABNORMAL HIGH (ref 0.44–1.00)
GFR, Estimated: >60 mL/min (ref >60)
Glucose, Bld: 107 mg/dL — ABNORMAL HIGH (ref 70–99)
Potassium: 4 mmol/L (ref 3.5–5.1)
Sodium: 136 mmol/L (ref 135–145)

## 2020-02-12 LAB — URINE CULTURE: Culture: >=100000 — AB

## 2020-02-12 LAB — CBC
HCT: 29.6 % — ABNORMAL LOW (ref 36.0–46.0)
Hemoglobin: 9.9 g/dL — ABNORMAL LOW (ref 12.0–15.0)
MCH: 31.6 pg (ref 26.0–34.0)
MCHC: 33.4 g/dL (ref 30.0–36.0)
MCV: 94.6 fL (ref 80.0–100.0)
Platelets: 116 10*3/uL — ABNORMAL LOW (ref 150–400)
RBC: 3.13 MIL/uL — ABNORMAL LOW (ref 3.87–5.11)
RDW: 12.1 % (ref 11.5–15.5)
WBC: 4.5 10*3/uL (ref 4.0–10.5)
nRBC: 0 % (ref 0.0–0.2)

## 2020-02-12 LAB — CULTURE, BLOOD (ROUTINE X 2)
Special Requests: ADEQUATE
Special Requests: ADEQUATE

## 2020-02-12 MED ORDER — SACCHAROMYCES BOULARDII 250 MG PO CAPS: 250.0000 mg | ORAL_CAPSULE | Freq: Two times a day (BID) | ORAL | 0 refills | Status: AC

## 2020-02-12 MED ORDER — CEPHALEXIN 500 MG PO CAPS: 500.0000 mg | ORAL_CAPSULE | Freq: Four times a day (QID) | ORAL | 0 refills | Status: AC

## 2020-02-12 NOTE — Discharge Summary (Signed)
Physician Discharge Summary  Christie Lara BSW:[id number] DOB: 1965/02/02 DOA: 02/09/2020  PCP: Gordy Savers, MD (Inactive)  Admit date: 02/09/2020 Discharge date: 02/12/2020  Admitted From: home Disposition:  home  Recommendations for Outpatient Follow-up:  1. Follow up with PCP in 1-2 weeks 2. F/u with Urology as scheduled. 3. Please obtain BMP/CBC in one week 4. Please follow up on the following pending results:  Home Health:no  Equipment/Devices: none  Discharge Condition: Stable Code Status:   Code Status: Full Code Diet recommendation:  Diet Order            Diet - low sodium heart healthy           Diet regular Room service appropriate? Yes; Fluid consistency: Thin  Diet effective now                 Brief/Interim Summary: 55 year old female with history of anxiety/depression presented to the ED with dysuria abdominal discomfort x48 hours along with nausea vomiting, suprapubic area pain. She was seen in the ED found to have fever tachycardia lactic acidosis UA abnormal consistent with UTI,CT scan of the abdomen shows left-sided hydronephrosis with perinephric stranding with dilatation of the ureter with abrupt transition point around the ureteropelvic junction. ER physician discussed with on-call urologist Dr. Marlou Porch who advised antibiotics following blood cultures and to follow-up with urologist. This time no definite intervention required since there was no obstructing stone. She was admitted on iv antibiotics. Blood cx and urien cx came back + with E coli. Her AKI improved with ivf. At this time afebrile At this time she is clinically stable.  She was waiting for culture sensitivity and will be discharged home once as e coli is pan-sensitive, added probiotics. PO antibiotics with instruction to follow-up urology.  Discharge Diagnoses:   E coli Sepsis POA due to left-sided pyelonephritis with hydronephrosis:  Clinically improved, patient discharged with oral  antibiotics based on culture sensitivity report to complete 2 weeks course and have her follow-up.  Urologist  Dr. Marlou Porch  Hydronephrosis of left kidney: Voiding well.  Outpatient urology follow-up.  Depression mood is stable, continue on fluoxetine.  AKI: baseline creat at 0.8-0.9 from 01/08/20.  AKI in the setting of sepsis.  AKI has resolved Recent Labs  Lab 02/09/20 1547 02/09/20 2347 02/10/20 0403 02/11/20 0537 02/12/20 0535  BUN 14  --  15 15 15   CREATININE 1.11* 1.19* 1.32* 1.18* 1.04*   HLD- crestor on hold per pcp 2/2 elevated lfts while on statins  Overweight with BMI 29, outpatient follow-up  Consults:  Urology  Subjective: Aaox3,no flank pain. Wants to go home  Discharge Exam: Vitals:   02/11/20 2108 02/12/20 0625  BP: 124/66 126/73  Pulse: 87 79  Resp: 20 18  Temp: 99.6 F (37.6 C) 98.7 F (37.1 C)  SpO2: 95% 94%   General: Pt is alert, awake, not in acute distress Cardiovascular: RRR, S1/S2 +, no rubs, no gallops Respiratory: CTA bilaterally, no wheezing, no rhonchi Abdominal: Soft, NT, ND, bowel sounds + Extremities: no edema, no cyanosis  Discharge Instructions  Discharge Instructions    Diet - low sodium heart healthy   Complete by: As directed    Discharge instructions   Complete by: As directed    Please follow-up with the urology office Dr. Marlou Porch, call the number provided  Please call call MD or return to ER for similar or worsening recurring problem that brought you to hospital or if any fever,nausea/vomiting,abdominal pain, uncontrolled pain, chest pain,  shortness of breath or any other alarming symptoms.  Please follow-up your doctor as instructed in a week time and call the office for appointment.  Please avoid alcohol, smoking, or any other illicit substance and maintain healthy habits including taking your regular medications as prescribed.  You were cared for by a hospitalist during your hospital stay. If you have any  questions about your discharge medications or the care you received while you were in the hospital after you are discharged, you can call the unit and ask to speak with the hospitalist on call if the hospitalist that took care of you is not available.  Once you are discharged, your primary care physician will handle any further medical issues. Please note that NO REFILLS for any discharge medications will be authorized once you are discharged, as it is imperative that you return to your primary care physician (or establish a relationship with a primary care physician if you do not have one) for your aftercare needs so that they can reassess your need for medications and monitor your lab values   Increase activity slowly   Complete by: As directed      Allergies as of 02/12/2020      Reactions   Sulfamethoxazole-trimethoprim Nausea And Vomiting, Nausea Only   Other reaction(s): Abdominal Pain, Fever (intolerance)   Elemental Sulfur    Sulfa Antibiotics    Erythromycin Nausea And Vomiting, Nausea Only   REACTION: nausea REACTION: nausea REACTION: nausea REACTION: nausea      Medication List    TAKE these medications   cephALEXin 500 MG capsule Commonly known as: KEFLEX Take 1 capsule (500 mg total) by mouth 4 (four) times daily for 12 days.   CRANBERRY PO Take 1 tablet by mouth at bedtime.   FLUoxetine 20 MG tablet Commonly known as: PROZAC Take 1 tablet (20 mg total) by mouth daily.   loratadine 10 MG tablet Commonly known as: CLARITIN Take 10 mg by mouth daily as needed for allergies.   saccharomyces boulardii 250 MG capsule Commonly known as: Florastor Take 1 capsule (250 mg total) by mouth 2 (two) times daily for 14 days.       Follow-up Information    Gordy Savers, MD Follow up.   Specialty: Internal Medicine       Crist Fat, MD Follow up in 2 week(s).   Specialty: Urology Contact information: [address] [place]  [address] [phone]              Allergies  Allergen Reactions  . Sulfamethoxazole-Trimethoprim Nausea And Vomiting and Nausea Only    Other reaction(s): Abdominal Pain, Fever (intolerance)  . Elemental Sulfur   . Sulfa Antibiotics   . Erythromycin Nausea And Vomiting and Nausea Only    REACTION: nausea REACTION: nausea REACTION: nausea REACTION: nausea     The results of significant diagnostics from this hospitalization (including imaging, microbiology, ancillary and laboratory) are listed below for reference.    Microbiology: Recent Results (from the past 240 hour(s))  Urine culture     Status: Abnormal   Collection Time: 02/09/20  8:10 PM   Specimen: Urine, Clean Catch  Result Value Ref Range Status   Specimen Description   Final    URINE, CLEAN CATCH Performed at [facility], [address], [place], [place] [address]    Special Requests   Final    NONE Performed at [facility], [address], [place], [place] [address]  Culture >=100,000 COLONIES/mL ESCHERICHIA COLI (A)  Final   Report Status 02/12/2020 FINAL  Final   Organism ID, Bacteria ESCHERICHIA COLI (A)  Final      Susceptibility   Escherichia coli - MIC*    AMPICILLIN <=2 SENSITIVE Sensitive     CEFAZOLIN <=4 SENSITIVE Sensitive     CEFEPIME <=0.12 SENSITIVE Sensitive     CEFTRIAXONE <=0.25 SENSITIVE Sensitive     CIPROFLOXACIN <=0.25 SENSITIVE Sensitive     GENTAMICIN <=1 SENSITIVE Sensitive     IMIPENEM <=0.25 SENSITIVE Sensitive     NITROFURANTOIN <=16 SENSITIVE Sensitive     TRIMETH/SULFA <=20 SENSITIVE Sensitive     AMPICILLIN/SULBACTAM <=2 SENSITIVE Sensitive     PIP/TAZO <=4 SENSITIVE Sensitive     * >=100,000 COLONIES/mL ESCHERICHIA COLI  Blood Culture (routine x 2)     Status: Abnormal   Collection Time: 02/09/20  8:42 PM   Specimen: BLOOD  Result Value Ref Range Status   Specimen Description   Final    BLOOD RIGHT ANTECUBITAL Performed at  [facility], [address] [place], [place] [address]    Special Requests   Final    BOTTLES DRAWN AEROBIC AND ANAEROBIC Blood Culture adequate volume Performed at [facility], [address], [place], [place] [address]    Culture  Setup Time   Final    GRAM NEGATIVE RODS AEROBIC BOTTLE ONLY CRITICAL VALUE NOTED.  VALUE IS CONSISTENT WITH PREVIOUSLY REPORTED AND CALLED VALUE.    Culture (A)  Final    ESCHERICHIA COLI SUSCEPTIBILITIES PERFORMED ON PREVIOUS CULTURE WITHIN THE LAST 5 DAYS. Performed at [facility], [address] [place], [place] [address]    Report Status 02/12/2020 FINAL  Final  Blood Culture (routine x 2)     Status: Abnormal   Collection Time: 02/09/20  8:42 PM   Specimen: BLOOD  Result Value Ref Range Status   Specimen Description   Final    BLOOD LEFT ANTECUBITAL Performed at [facility], [address], [place], [place] [address]    Special Requests   Final    BOTTLES DRAWN AEROBIC AND ANAEROBIC Blood Culture adequate volume Performed at [facility], [address], [place], [place] [address]    Culture  Setup Time   Final    GRAM NEGATIVE RODS IN BOTH AEROBIC AND ANAEROBIC BOTTLES CRITICAL RESULT CALLED TO, READ BACK BY AND VERIFIED WITH: PHARMD N GOOGOVAC [id number] AT 1151 BY CM Performed at [facility], [address], [place], [place] [address]    Culture ESCHERICHIA COLI (A)  Final   Report Status 02/12/2020 FINAL  Final   Organism ID, Bacteria ESCHERICHIA COLI  Final      Susceptibility   Escherichia coli - MIC*    AMPICILLIN <=2 SENSITIVE Sensitive     CEFAZOLIN <=4 SENSITIVE Sensitive     CEFEPIME <=0.12 SENSITIVE Sensitive     CEFTAZIDIME <=1 SENSITIVE Sensitive     CEFTRIAXONE <=0.25 SENSITIVE Sensitive     CIPROFLOXACIN <=0.25 SENSITIVE Sensitive     GENTAMICIN <=1 SENSITIVE Sensitive     IMIPENEM <=0.25 SENSITIVE Sensitive     TRIMETH/SULFA  <=20 SENSITIVE Sensitive     AMPICILLIN/SULBACTAM <=2 SENSITIVE Sensitive     PIP/TAZO <=4 SENSITIVE Sensitive     * ESCHERICHIA COLI  Blood Culture ID Panel (Reflexed)     Status: Abnormal   Collection Time: 02/09/20  8:42 PM  Result Value Ref Range  Status   Enterococcus faecalis NOT DETECTED NOT DETECTED Final   Enterococcus Faecium NOT DETECTED NOT DETECTED Final   Listeria monocytogenes NOT DETECTED NOT DETECTED Final   Staphylococcus species NOT DETECTED NOT DETECTED Final   Staphylococcus aureus (BCID) NOT DETECTED NOT DETECTED Final   Staphylococcus epidermidis NOT DETECTED NOT DETECTED Final   Staphylococcus lugdunensis NOT DETECTED NOT DETECTED Final   Streptococcus species NOT DETECTED NOT DETECTED Final   Streptococcus agalactiae NOT DETECTED NOT DETECTED Final   Streptococcus pneumoniae NOT DETECTED NOT DETECTED Final   Streptococcus pyogenes NOT DETECTED NOT DETECTED Final   A.calcoaceticus-baumannii NOT DETECTED NOT DETECTED Final   Bacteroides fragilis NOT DETECTED NOT DETECTED Final   Enterobacterales DETECTED (A) NOT DETECTED Final    Comment: Enterobacterales represent a large order of gram negative bacteria, not a single organism. CRITICAL RESULT CALLED TO, READ BACK BY AND VERIFIED WITH: PHARMD N GOOGOVAC [id number] AT 1152 BY CM    Enterobacter cloacae complex NOT DETECTED NOT DETECTED Final   Escherichia coli DETECTED (A) NOT DETECTED Final    Comment: CRITICAL RESULT CALLED TO, READ BACK BY AND VERIFIED WITH: PHARMD N GOOGOVAC [id number] AT 1151 BY CM    Klebsiella aerogenes NOT DETECTED NOT DETECTED Final   Klebsiella oxytoca NOT DETECTED NOT DETECTED Final   Klebsiella pneumoniae NOT DETECTED NOT DETECTED Final   Proteus species NOT DETECTED NOT DETECTED Final   Salmonella species NOT DETECTED NOT DETECTED Final   Serratia marcescens NOT DETECTED NOT DETECTED Final   Haemophilus influenzae NOT DETECTED NOT DETECTED Final   Neisseria meningitidis NOT DETECTED NOT  DETECTED Final   Pseudomonas aeruginosa NOT DETECTED NOT DETECTED Final   Stenotrophomonas maltophilia NOT DETECTED NOT DETECTED Final   Candida albicans NOT DETECTED NOT DETECTED Final   Candida auris NOT DETECTED NOT DETECTED Final   Candida glabrata NOT DETECTED NOT DETECTED Final   Candida krusei NOT DETECTED NOT DETECTED Final   Candida parapsilosis NOT DETECTED NOT DETECTED Final   Candida tropicalis NOT DETECTED NOT DETECTED Final   Cryptococcus neoformans/gattii NOT DETECTED NOT DETECTED Final   CTX-M ESBL NOT DETECTED NOT DETECTED Final   Carbapenem resistance IMP NOT DETECTED NOT DETECTED Final   Carbapenem resistance KPC NOT DETECTED NOT DETECTED Final   Carbapenem resistance NDM NOT DETECTED NOT DETECTED Final   Carbapenem resist OXA 48 LIKE NOT DETECTED NOT DETECTED Final   Carbapenem resistance VIM NOT DETECTED NOT DETECTED Final    Comment: Performed at [facility], [address] [place], [place] [address]  Resp Panel by RT-PCR (Flu A&B, Covid) Nasopharyngeal Swab     Status: None   Collection Time: 02/09/20 10:07 PM   Specimen: Nasopharyngeal Swab; Nasopharyngeal(NP) swabs in vial transport medium  Result Value Ref Range Status   SARS Coronavirus 2 by RT PCR NEGATIVE NEGATIVE Final    Comment: (NOTE) SARS-CoV-2 target nucleic acids are NOT DETECTED.  The SARS-CoV-2 RNA is generally detectable in upper respiratory specimens during the acute phase of infection. The lowest concentration of SARS-CoV-2 viral copies this assay can detect is 138 copies/mL. A negative result does not preclude SARS-Cov-2 infection and should not be used as the sole basis for treatment or other patient management decisions. A negative result may occur with  improper specimen collection/handling, submission of specimen other than nasopharyngeal swab, presence of viral mutation(s) within the areas targeted by this assay, and inadequate number of viral copies(<138 copies/mL). A negative  result must be combined with clinical observations, patient  history, and epidemiological information. The expected result is Negative.  Fact Sheet for Patients:  BloggerCourse.com  Fact Sheet for Healthcare Providers:  SeriousBroker.it  This test is no t yet approved or cleared by the [place] FDA and  has been authorized for detection and/or diagnosis of SARS-CoV-2 by FDA under an Emergency Use Authorization (EUA). This EUA will remain  in effect (meaning this test can be used) for the duration of the COVID-19 declaration under Section 564(b)(1) of the Act, 21 U.S.C.section 360bbb-3(b)(1), unless the authorization is terminated  or revoked sooner.       Influenza A by PCR NEGATIVE NEGATIVE Final   Influenza B by PCR NEGATIVE NEGATIVE Final    Comment: (NOTE) The Xpert Xpress SARS-CoV-2/FLU/RSV plus assay is intended as an aid in the diagnosis of influenza from Nasopharyngeal swab specimens and should not be used as a sole basis for treatment. Nasal washings and aspirates are unacceptable for Xpert Xpress SARS-CoV-2/FLU/RSV testing.  Fact Sheet for Patients: BloggerCourse.com  Fact Sheet for Healthcare Providers: SeriousBroker.it  This test is not yet approved or cleared by the [place] FDA and has been authorized for detection and/or diagnosis of SARS-CoV-2 by FDA under an Emergency Use Authorization (EUA). This EUA will remain in effect (meaning this test can be used) for the duration of the COVID-19 declaration under Section 564(b)(1) of the Act, 21 U.S.C. section 360bbb-3(b)(1), unless the authorization is terminated or revoked.  Performed at [facility], [address], [place], [place] [address]     Procedures/Studies: DG Chest Port 1 View  Result Date: 02/09/2020 CLINICAL DATA:  Fever EXAM: PORTABLE CHEST 1 VIEW COMPARISON:  None.  FINDINGS: The heart size and mediastinal contours are within normal limits. Both lungs are clear. The visualized skeletal structures are unremarkable. IMPRESSION: No active disease. Electronically Signed   By: Deatra Robinson M.D.   On: 02/09/2020 20:40   CT Renal Stone Study  Result Date: 02/09/2020 CLINICAL DATA:  Lower abdominal pain, fever, nausea and dysuria since yesterday EXAM: CT ABDOMEN AND PELVIS WITHOUT CONTRAST TECHNIQUE: Multidetector CT imaging of the abdomen and pelvis was performed following the standard protocol without IV contrast. COMPARISON:  None FINDINGS: Lower chest: Lung bases are clear. Normal heart size. No pericardial effusion. Hepatobiliary: No visible focal liver lesion with limitations of this unenhanced CT. Smooth liver surface contour. Normal liver attenuation. Normal gallbladder and biliary tree without visible calcified gallstone. Pancreas: No pancreatic ductal dilatation or surrounding inflammatory changes. Spleen: Normal in size. No concerning splenic lesions. Adrenals/Urinary Tract: Normal adrenal glands. Extensive left perinephric stranding and free fluid with severe hydroureteronephrosis to the level of an abrupt transition in the proximal near the ureteropelvic junction. No clear obstructive calculus is seen. No discernible obstructing lesion. Mild urothelial thickening is noted. Additional circumferential thickening of the urinary bladder is seen. No right urinary tract dilatation or perinephric stranding. No concerning focal renal lesion. Stomach/Bowel: Small sliding-type hiatal hernia. Distal stomach is unremarkable. Tiny air-filled duodenal diverticulum (2/40), no inflammation. Duodenum with a normal sweep across the midline abdomen. No small bowel thickening or dilatation. Normal appendix in the right lower quadrant. No colonic dilatation or wall thickening. Vascular/Lymphatic: Atherosclerotic calcifications within the abdominal aorta and branch vessels. No aneurysm or  ectasia. No enlarged abdominopelvic lymph nodes. Reproductive: Anteverted uterus. No concerning adnexal lesions are seen. Other: Extensive perinephric stranding with small amount of retroperitoneal free fluid additional small volume low-attenuation fluid is seen in the deep pelvis as well. No free air.  No bowel containing hernias. Musculoskeletal: No acute osseous abnormality or suspicious osseous lesion. Multilevel degenerative changes are present in the imaged portions of the spine. Features most pronounced at L4-5 with some more sclerotic Modic type endplate changes with Schmorl's node formations. Additional degenerative changes in the hips and pelvis. IMPRESSION: 1. Extensive left perinephric stranding and free fluid with severe hydronephrosis and dilatation to the level of an abrupt transition near the ureteropelvic junction. No obstructive calculus nor discernible obstructing lesion is seen. Mild urothelial thickening is noted with circumferential thickening of the urinary bladder wall with mild urothelial thickening, suggestive of a concomitant ascending tract infection well particularly given the patient's urinalysis. Consider emergent urologic consultation. 2. Small sliding-type hiatal hernia. 3. Anteverted uterus. 4. Aortic Atherosclerosis (ICD10-I70.0). These results were called by telephone at the time of interpretation on 02/09/2020 at 9:51 pm to provider ADAM CURATOLO , who verbally acknowledged these results. Electronically Signed   By: Kreg Shropshire M.D.   On: 02/09/2020 21:52    Labs: BNP (last 3 results) No results for input(s): BNP in the last 8760 hours. Basic Metabolic Panel: Recent Labs  Lab 02/09/20 1547 02/09/20 2347 02/10/20 0403 02/11/20 0537 02/12/20 0535  NA 133*  --  135 136 136  K 3.7  --  4.3 3.5 4.0  CL 99  --  100 103 104  CO2 23  --  25 24 23   GLUCOSE 140*  --  123* 121* 107*  BUN 14  --  15 15 15   CREATININE 1.11* 1.19* 1.32* 1.18* 1.04*  CALCIUM 9.2  --  8.3*  8.2* 8.4*   Liver Function Tests: Recent Labs  Lab 02/09/20 1547 02/10/20 0403  AST 29 29  ALT 35 30  ALKPHOS 59 46  BILITOT 0.8 0.9  PROT 7.5 6.7  ALBUMIN 4.5 3.8   Recent Labs  Lab 02/09/20 1547  LIPASE 32   No results for input(s): AMMONIA in the last 168 hours. CBC: Recent Labs  Lab 02/09/20 1547 02/09/20 2347 02/10/20 0403 02/11/20 0537 02/12/20 0535  WBC 10.6* 10.4 11.7* 6.1 4.5  NEUTROABS  --   --  10.5*  --   --   HGB 13.1 11.2* 11.5* 10.0* 9.9*  HCT 39.0 33.5* 34.1* 30.9* 29.6*  MCV 93.1 94.1 93.7 95.4 94.6  PLT 144* 130* 127* 104* 116*   Cardiac Enzymes: No results for input(s): CKTOTAL, CKMB, CKMBINDEX, TROPONINI in the last 168 hours. BNP: Invalid input(s): POCBNP CBG: No results for input(s): GLUCAP in the last 168 hours. D-Dimer No results for input(s): DDIMER in the last 72 hours. Hgb A1c No results for input(s): HGBA1C in the last 72 hours. Lipid Profile No results for input(s): CHOL, HDL, LDLCALC, TRIG, CHOLHDL, LDLDIRECT in the last 72 hours. Thyroid function studies No results for input(s): TSH, T4TOTAL, T3FREE, THYROIDAB in the last 72 hours.  Invalid input(s): FREET3 Anemia work up No results for input(s): VITAMINB12, FOLATE, FERRITIN, TIBC, IRON, RETICCTPCT in the last 72 hours. Urinalysis    Component Value Date/Time   COLORURINE AMBER (A) 02/09/2020 1643   APPEARANCEUR CLOUDY (A) 02/09/2020 1643   LABSPEC 1.016 02/09/2020 1643   PHURINE 5.0 02/09/2020 1643   GLUCOSEU NEGATIVE 02/09/2020 1643   HGBUR LARGE (A) 02/09/2020 1643   BILIRUBINUR NEGATIVE 02/09/2020 1643   BILIRUBINUR Mod 10/01/2013 0847   KETONESUR NEGATIVE 02/09/2020 1643   PROTEINUR 100 (A) 02/09/2020 1643   UROBILINOGEN >=8.0 10/01/2013 0847   NITRITE POSITIVE (A) 02/09/2020 1643   LEUKOCYTESUR LARGE (A)  02/09/2020 1643   Sepsis Labs Invalid input(s): PROCALCITONIN,  WBC,  LACTICIDVEN Microbiology Recent Results (from the past 240 hour(s))  Urine culture      Status: Abnormal   Collection Time: 02/09/20  8:10 PM   Specimen: Urine, Clean Catch  Result Value Ref Range Status   Specimen Description   Final    URINE, CLEAN CATCH Performed at [facility], [address] [place], [place] [address]    Special Requests   Final    NONE Performed at [facility], [address], [place], [place] [address]    Culture >=100,000 COLONIES/mL ESCHERICHIA COLI (A)  Final   Report Status 02/12/2020 FINAL  Final   Organism ID, Bacteria ESCHERICHIA COLI (A)  Final      Susceptibility   Escherichia coli - MIC*    AMPICILLIN <=2 SENSITIVE Sensitive     CEFAZOLIN <=4 SENSITIVE Sensitive     CEFEPIME <=0.12 SENSITIVE Sensitive     CEFTRIAXONE <=0.25 SENSITIVE Sensitive     CIPROFLOXACIN <=0.25 SENSITIVE Sensitive     GENTAMICIN <=1 SENSITIVE Sensitive     IMIPENEM <=0.25 SENSITIVE Sensitive     NITROFURANTOIN <=16 SENSITIVE Sensitive     TRIMETH/SULFA <=20 SENSITIVE Sensitive     AMPICILLIN/SULBACTAM <=2 SENSITIVE Sensitive     PIP/TAZO <=4 SENSITIVE Sensitive     * >=100,000 COLONIES/mL ESCHERICHIA COLI  Blood Culture (routine x 2)     Status: Abnormal   Collection Time: 02/09/20  8:42 PM   Specimen: BLOOD  Result Value Ref Range Status   Specimen Description   Final    BLOOD RIGHT ANTECUBITAL Performed at [facility], [address], [place], [place] [address]    Special Requests   Final    BOTTLES DRAWN AEROBIC AND ANAEROBIC Blood Culture adequate volume Performed at [facility], [address], [place], [place] [address]    Culture  Setup Time   Final    GRAM NEGATIVE RODS AEROBIC BOTTLE ONLY CRITICAL VALUE NOTED.  VALUE IS CONSISTENT WITH PREVIOUSLY REPORTED AND CALLED VALUE.    Culture (A)  Final    ESCHERICHIA COLI SUSCEPTIBILITIES PERFORMED ON PREVIOUS CULTURE WITHIN THE LAST 5 DAYS. Performed at [facility], [address], [place], [place]  [address]    Report Status 02/12/2020 FINAL  Final  Blood Culture (routine x 2)     Status: Abnormal   Collection Time: 02/09/20  8:42 PM   Specimen: BLOOD  Result Value Ref Range Status   Specimen Description   Final    BLOOD LEFT ANTECUBITAL Performed at [facility], [address] [place], [place] [address]    Special Requests   Final    BOTTLES DRAWN AEROBIC AND ANAEROBIC Blood Culture adequate volume Performed at [facility], [address] [place], [place] [address]    Culture  Setup Time   Final    GRAM NEGATIVE RODS IN BOTH AEROBIC AND ANAEROBIC BOTTLES CRITICAL RESULT CALLED TO, READ BACK BY AND VERIFIED WITH: PHARMD N GOOGOVAC [id number] AT 1151 BY CM Performed at [facility], [address] [place], [place] [address]    Culture ESCHERICHIA COLI (A)  Final   Report Status 02/12/2020 FINAL  Final   Organism ID, Bacteria ESCHERICHIA COLI  Final      Susceptibility   Escherichia coli - MIC*    AMPICILLIN <=2 SENSITIVE Sensitive     CEFAZOLIN <=4 SENSITIVE Sensitive  CEFEPIME <=0.12 SENSITIVE Sensitive     CEFTAZIDIME <=1 SENSITIVE Sensitive     CEFTRIAXONE <=0.25 SENSITIVE Sensitive     CIPROFLOXACIN <=0.25 SENSITIVE Sensitive     GENTAMICIN <=1 SENSITIVE Sensitive     IMIPENEM <=0.25 SENSITIVE Sensitive     TRIMETH/SULFA <=20 SENSITIVE Sensitive     AMPICILLIN/SULBACTAM <=2 SENSITIVE Sensitive     PIP/TAZO <=4 SENSITIVE Sensitive     * ESCHERICHIA COLI  Blood Culture ID Panel (Reflexed)     Status: Abnormal   Collection Time: 02/09/20  8:42 PM  Result Value Ref Range Status   Enterococcus faecalis NOT DETECTED NOT DETECTED Final   Enterococcus Faecium NOT DETECTED NOT DETECTED Final   Listeria monocytogenes NOT DETECTED NOT DETECTED Final   Staphylococcus species NOT DETECTED NOT DETECTED Final   Staphylococcus aureus (BCID) NOT DETECTED NOT DETECTED Final   Staphylococcus epidermidis NOT DETECTED NOT DETECTED Final    Staphylococcus lugdunensis NOT DETECTED NOT DETECTED Final   Streptococcus species NOT DETECTED NOT DETECTED Final   Streptococcus agalactiae NOT DETECTED NOT DETECTED Final   Streptococcus pneumoniae NOT DETECTED NOT DETECTED Final   Streptococcus pyogenes NOT DETECTED NOT DETECTED Final   A.calcoaceticus-baumannii NOT DETECTED NOT DETECTED Final   Bacteroides fragilis NOT DETECTED NOT DETECTED Final   Enterobacterales DETECTED (A) NOT DETECTED Final    Comment: Enterobacterales represent a large order of gram negative bacteria, not a single organism. CRITICAL RESULT CALLED TO, READ BACK BY AND VERIFIED WITH: PHARMD N GOOGOVAC [id number] AT 1152 BY CM    Enterobacter cloacae complex NOT DETECTED NOT DETECTED Final   Escherichia coli DETECTED (A) NOT DETECTED Final    Comment: CRITICAL RESULT CALLED TO, READ BACK BY AND VERIFIED WITH: PHARMD N GOOGOVAC [id number] AT 1151 BY CM    Klebsiella aerogenes NOT DETECTED NOT DETECTED Final   Klebsiella oxytoca NOT DETECTED NOT DETECTED Final   Klebsiella pneumoniae NOT DETECTED NOT DETECTED Final   Proteus species NOT DETECTED NOT DETECTED Final   Salmonella species NOT DETECTED NOT DETECTED Final   Serratia marcescens NOT DETECTED NOT DETECTED Final   Haemophilus influenzae NOT DETECTED NOT DETECTED Final   Neisseria meningitidis NOT DETECTED NOT DETECTED Final   Pseudomonas aeruginosa NOT DETECTED NOT DETECTED Final   Stenotrophomonas maltophilia NOT DETECTED NOT DETECTED Final   Candida albicans NOT DETECTED NOT DETECTED Final   Candida auris NOT DETECTED NOT DETECTED Final   Candida glabrata NOT DETECTED NOT DETECTED Final   Candida krusei NOT DETECTED NOT DETECTED Final   Candida parapsilosis NOT DETECTED NOT DETECTED Final   Candida tropicalis NOT DETECTED NOT DETECTED Final   Cryptococcus neoformans/gattii NOT DETECTED NOT DETECTED Final   CTX-M ESBL NOT DETECTED NOT DETECTED Final   Carbapenem resistance IMP NOT DETECTED NOT DETECTED Final    Carbapenem resistance KPC NOT DETECTED NOT DETECTED Final   Carbapenem resistance NDM NOT DETECTED NOT DETECTED Final   Carbapenem resist OXA 48 LIKE NOT DETECTED NOT DETECTED Final   Carbapenem resistance VIM NOT DETECTED NOT DETECTED Final    Comment: Performed at [facility], [address], [place], [place] [address]  Resp Panel by RT-PCR (Flu A&B, Covid) Nasopharyngeal Swab     Status: None   Collection Time: 02/09/20 10:07 PM   Specimen: Nasopharyngeal Swab; Nasopharyngeal(NP) swabs in vial transport medium  Result Value Ref Range Status   SARS Coronavirus 2 by RT PCR NEGATIVE NEGATIVE Final    Comment: (NOTE) SARS-CoV-2 target nucleic acids are NOT DETECTED.  The SARS-CoV-2 RNA is generally detectable in upper respiratory specimens during the acute phase of infection. The lowest concentration of SARS-CoV-2 viral copies this assay can detect is 138 copies/mL. A negative result does not preclude SARS-Cov-2 infection and should not be used as the sole basis for treatment or other patient management decisions. A negative result may occur with  improper specimen collection/handling, submission of specimen other than nasopharyngeal swab, presence of viral mutation(s) within the areas targeted by this assay, and inadequate number of viral copies(<138 copies/mL). A negative result must be combined with clinical observations, patient history, and epidemiological information. The expected result is Negative.  Fact Sheet for Patients:  BloggerCourse.com  Fact Sheet for Healthcare Providers:  SeriousBroker.it  This test is no t yet approved or cleared by the [place] FDA and  has been authorized for detection and/or diagnosis of SARS-CoV-2 by FDA under an Emergency Use Authorization (EUA). This EUA will remain  in effect (meaning this test can be used) for the duration of the COVID-19 declaration under Section 564(b)(1) of  the Act, 21 U.S.C.section 360bbb-3(b)(1), unless the authorization is terminated  or revoked sooner.       Influenza A by PCR NEGATIVE NEGATIVE Final   Influenza B by PCR NEGATIVE NEGATIVE Final    Comment: (NOTE) The Xpert Xpress SARS-CoV-2/FLU/RSV plus assay is intended as an aid in the diagnosis of influenza from Nasopharyngeal swab specimens and should not be used as a sole basis for treatment. Nasal washings and aspirates are unacceptable for Xpert Xpress SARS-CoV-2/FLU/RSV testing.  Fact Sheet for Patients: BloggerCourse.com  Fact Sheet for Healthcare Providers: SeriousBroker.it  This test is not yet approved or cleared by the [place] FDA and has been authorized for detection and/or diagnosis of SARS-CoV-2 by FDA under an Emergency Use Authorization (EUA). This EUA will remain in effect (meaning this test can be used) for the duration of the COVID-19 declaration under Section 564(b)(1) of the Act, 21 U.S.C. section 360bbb-3(b)(1), unless the authorization is terminated or revoked.  Performed at [facility], [address] [place], [place] [address]      Time coordinating discharge: 25 minutes  SIGNED: Lanae Boast, MD  [facility] 02/12/2020, 8:51 AM  If 7PM-7AM, please contact night-coverage [email or web address]

## 2020-02-12 NOTE — Plan of Care (Signed)
  Problem: Fluid Volume: Goal: Hemodynamic stability will improve Outcome: Progressing   Problem: Activity: Goal: Risk for activity intolerance will decrease Outcome: Progressing   Problem: Nutrition: Goal: Adequate nutrition will be maintained Outcome: Progressing   Problem: Coping: Goal: Level of anxiety will decrease Outcome: Progressing   Problem: Elimination: Goal: Will not experience complications related to urinary retention Outcome: Progressing   Problem: Pain Managment: Goal: General experience of comfort will improve Outcome: Progressing   Problem: Safety: Goal: Ability to remain free from injury will improve Outcome: Progressing   Problem: Skin Integrity: Goal: Risk for impaired skin integrity will decrease Outcome: Progressing

## 2020-02-12 NOTE — Progress Notes (Signed)
Pt discharged to home in stable, ambulatory condition accompanied by husband. Discharge instructions reviewed; pt verbalized understanding. Pt and husband deny further questions or concerns at this time.  Ardyth Gal, RN 02/12/2020

## 2020-02-29 ENCOUNTER — Ambulatory Visit
Admission: RE | Admit: 2020-02-29 | Discharge: 2020-02-29 | Disposition: A | Discharge status: Home / Self Care | Payer: BC Managed Care – PPO | Source: Ambulatory Visit | Attending: Physician Assistant | Admitting: Physician Assistant

## 2020-02-29 ENCOUNTER — Other Ambulatory Visit: Payer: Self-pay

## 2020-02-29 DIAGNOSIS — E2839 Other primary ovarian failure: Secondary | ICD-10-CM

## 2020-02-29 DIAGNOSIS — Z1231 Encounter for screening mammogram for malignant neoplasm of breast: Secondary | ICD-10-CM

## 2020-03-15 ENCOUNTER — Other Ambulatory Visit: Payer: Self-pay | Admitting: Urology

## 2020-03-15 ENCOUNTER — Other Ambulatory Visit (HOSPITAL_COMMUNITY): Payer: Self-pay | Admitting: Urology

## 2020-03-15 DIAGNOSIS — N130 Hydronephrosis with ureteropelvic junction obstruction: Secondary | ICD-10-CM

## 2020-03-30 ENCOUNTER — Ambulatory Visit (HOSPITAL_COMMUNITY)
Admission: RE | Admit: 2020-03-30 | Discharge: 2020-03-30 | Disposition: A | Discharge status: Home / Self Care | Payer: BC Managed Care – PPO | Source: Ambulatory Visit | Attending: Urology | Admitting: Urology

## 2020-03-30 ENCOUNTER — Other Ambulatory Visit: Payer: Self-pay

## 2020-03-30 DIAGNOSIS — N130 Hydronephrosis with ureteropelvic junction obstruction: Secondary | ICD-10-CM | POA: Insufficient documentation

## 2020-03-30 MED ORDER — TECHNETIUM TC 99M MERTIATIDE: 5.1000 | Freq: Once | INTRAVENOUS | Status: DC | PRN

## 2020-03-30 MED ORDER — FUROSEMIDE 10 MG/ML IJ SOLN
INTRAMUSCULAR | Status: AC
  Filled 2020-03-30: qty 4

## 2020-03-30 MED ORDER — FUROSEMIDE 10 MG/ML IJ SOLN: 40.0000 mg | Freq: Once | INTRAMUSCULAR | Status: DC

## 2020-04-25 ENCOUNTER — Other Ambulatory Visit: Payer: Self-pay | Admitting: Urology

## 2020-07-01 NOTE — Progress Notes (Signed)
DUE TO COVID-19 ONLY ONE VISITOR IS ALLOWED TO COME WITH YOU AND STAY IN THE WAITING ROOM ONLY DURING PRE OP AND PROCEDURE DAY OF SURGERY. THE 1 VISITOR  MAY VISIT WITH YOU AFTER SURGERY IN YOUR PRIVATE ROOM DURING VISITING HOURS ONLY!  YOU NEED TO HAVE A COVID 19 TEST ON___7/12/22 ____ @_______ , THIS TEST MUST BE DONE BEFORE SURGERY,  COVID TESTING SITE [address] [place] , IT IS ON THE RIGHT GOING OUT [address] APPROXIMATELY  2 MINUTES PAST [employer] ON THE RIGHT. ONCE YOUR COVID TEST IS COMPLETED,  PLEASE BEGIN THE QUARANTINE INSTRUCTIONS AS OUTLINED IN YOUR HANDOUT.                Christie Lara  07/01/2020   Your procedure is scheduled on:           07/15/20   Report to [facility] Main  Entrance   Report to admitting at   0515 AM     Call this number if you have problems the morning of surgery [phone]    Remember: Do not eat food , candy gum or mints :After Midnight. You may have clear liquids from midnight until   0415AM   CLEAR LIQUID DIET   Foods Allowed                                                                       Coffee and tea, regular and decaf                              Plain Jell-O any favor except red or purple                                            Fruit ices (not with fruit pulp)                                      Iced Popsicles                                     Carbonated beverages, regular and diet                                    Cranberry, grape and apple juices Sports drinks like Gatorade Lightly seasoned clear broth or consume(fat free) Sugar, honey syrup   _____________________________________________________________________    BRUSH YOUR TEETH MORNING OF SURGERY AND RINSE YOUR MOUTH OUT, NO CHEWING GUM CANDY OR MINTS.     Take these medicines the morning of surgery with A SIP OF WATER:     CLARITIN, PROZAC DO NOT TAKE ANY DIABETIC MEDICATIONS DAY OF YOUR SURGERY  You may not have any metal on your body including hair pins and              piercings  Do not wear jewelry, make-up, lotions, powders or perfumes, deodorant             Do not wear nail polish on your fingernails.  Do not shave  48 hours prior to surgery.              Men may shave face and neck.   Do not bring valuables to the hospital. [place].  Contacts, dentures or bridgework may not be worn into surgery.  Leave suitcase in the car. After surgery it may be brought to your room.     Patients discharged the day of surgery will not be allowed to drive home. IF YOU ARE HAVING SURGERY AND GOING HOME THE SAME DAY, YOU MUST HAVE AN ADULT TO DRIVE YOU HOME AND BE WITH YOU FOR 24 HOURS. YOU MAY GO HOME BY TAXI OR UBER OR ORTHERWISE, BUT AN ADULT MUST ACCOMPANY YOU HOME AND STAY WITH YOU FOR 24 HOURS.  Name and phone number of your driver:  Special Instructions: N/A              Please read over the following fact sheets you were given: _____________________________________________________________________  [facility] - Preparing for Surgery Before surgery, you can play an important role.  Because skin is not sterile, your skin needs to be as free of germs as possible.  You can reduce the number of germs on your skin by washing with CHG (chlorahexidine gluconate) soap before surgery.  CHG is an antiseptic cleaner which kills germs and bonds with the skin to continue killing germs even after washing. Please DO NOT use if you have an allergy to CHG or antibacterial soaps.  If your skin becomes reddened/irritated stop using the CHG and inform your nurse when you arrive at Short Stay. Do not shave (including legs and underarms) for at least 48 hours prior to the first CHG shower.  You may shave your face/neck. Please follow these instructions carefully:  1.  Shower with CHG Soap the night before surgery and the  morning of  Surgery.  2.  If you choose to wash your hair, wash your hair first as usual with your  normal  shampoo.  3.  After you shampoo, rinse your hair and body thoroughly to remove the  shampoo.                           4.  Use CHG as you would any other liquid soap.  You can apply chg directly  to the skin and wash                       Gently with a scrungie or clean washcloth.  5.  Apply the CHG Soap to your body ONLY FROM THE NECK DOWN.   Do not use on face/ open                           Wound or open sores. Avoid contact with eyes, ears mouth and genitals (private parts).  Wash face,  Genitals (private parts) with your normal soap.             6.  Wash thoroughly, paying special attention to the area where your surgery  will be performed.  7.  Thoroughly rinse your body with warm water from the neck down.  8.  DO NOT shower/wash with your normal soap after using and rinsing off  the CHG Soap.                9.  Pat yourself dry with a clean towel.            10.  Wear clean pajamas.            11.  Place clean sheets on your bed the night of your first shower and do not  sleep with pets. Day of Surgery : Do not apply any lotions/deodorants the morning of surgery.  Please wear clean clothes to the hospital/surgery center.  FAILURE TO FOLLOW THESE INSTRUCTIONS MAY RESULT IN THE CANCELLATION OF YOUR SURGERY PATIENT SIGNATURE_________________________________  NURSE SIGNATURE__________________________________  ________________________________________________________________________

## 2020-07-06 ENCOUNTER — Encounter (HOSPITAL_COMMUNITY): Payer: Self-pay

## 2020-07-06 ENCOUNTER — Encounter (HOSPITAL_COMMUNITY)
Admission: RE | Admit: 2020-07-06 | Discharge: 2020-07-06 | Disposition: A | Discharge status: Home / Self Care | Payer: BC Managed Care – PPO | Source: Ambulatory Visit | Attending: Urology | Admitting: Urology

## 2020-07-06 ENCOUNTER — Other Ambulatory Visit: Payer: Self-pay

## 2020-07-06 DIAGNOSIS — Z01812 Encounter for preprocedural laboratory examination: Secondary | ICD-10-CM | POA: Insufficient documentation

## 2020-07-06 LAB — CBC
HCT: 38.9 % (ref 36.0–46.0)
Hemoglobin: 13.2 g/dL (ref 12.0–15.0)
MCH: 30.8 pg (ref 26.0–34.0)
MCHC: 33.9 g/dL (ref 30.0–36.0)
MCV: 90.9 fL (ref 80.0–100.0)
Platelets: 162 10*3/uL (ref 150–400)
RBC: 4.28 MIL/uL (ref 3.87–5.11)
RDW: 12.2 % (ref 11.5–15.5)
WBC: 4.4 10*3/uL (ref 4.0–10.5)
nRBC: 0 % (ref 0.0–0.2)

## 2020-07-06 LAB — BASIC METABOLIC PANEL
Anion gap: 7 (ref 5–15)
BUN: 13 mg/dL (ref 6–20)
CO2: 28 mmol/L (ref 22–32)
Calcium: 9.2 mg/dL (ref 8.9–10.3)
Chloride: 104 mmol/L (ref 98–111)
Creatinine, Ser: 0.74 mg/dL (ref 0.44–1.00)
GFR, Estimated: >60 mL/min (ref >60)
Glucose, Bld: 96 mg/dL (ref 70–99)
Potassium: 4.1 mmol/L (ref 3.5–5.1)
Sodium: 139 mmol/L (ref 135–145)

## 2020-07-06 NOTE — Progress Notes (Signed)
Anesthesia Review:  PCP: Lita Mains  Cardiologist : none  Chest x-ray : 1V- 02/09/20 - epic  EKG :02/2020-epic  Echo : Stress test: Cardiac Cath :  Activity level:  can do a flight of stairs without difficulty  Sleep Study/ CPAP : none  Fasting Blood Sugar :      / Checks Blood Sugar -- times a day:   Blood Thinner/ Instructions /Last Dose: ASA / Instructions/ Last Dose :

## 2020-07-12 ENCOUNTER — Other Ambulatory Visit (HOSPITAL_COMMUNITY)
Admission: RE | Admit: 2020-07-12 | Discharge: 2020-07-12 | Disposition: A | Discharge status: Home / Self Care | Payer: BC Managed Care – PPO | Source: Ambulatory Visit | Attending: Urology | Admitting: Urology

## 2020-07-12 DIAGNOSIS — Z01812 Encounter for preprocedural laboratory examination: Secondary | ICD-10-CM | POA: Insufficient documentation

## 2020-07-12 DIAGNOSIS — Z20822 Contact with and (suspected) exposure to covid-19: Secondary | ICD-10-CM | POA: Insufficient documentation

## 2020-07-12 LAB — SARS CORONAVIRUS 2 (TAT 6-24 HRS): SARS Coronavirus 2: NEGATIVE

## 2020-07-14 NOTE — Anesthesia Preprocedure Evaluation (Addendum)
Anesthesia Evaluation  Patient identified by MRN, date of birth, ID band Patient awake    Reviewed: Allergy & Precautions, NPO status , Patient's Chart, lab work & pertinent test results  Airway Mallampati: II  TM Distance: >3 FB Neck ROM: Full    Dental no notable dental hx. (+) Teeth Intact, Dental Advisory Given   Pulmonary neg pulmonary ROS,    Pulmonary exam normal breath sounds clear to auscultation       Cardiovascular Exercise Tolerance: Good negative cardio ROS Normal cardiovascular exam Rhythm:Regular Rate:Normal     Neuro/Psych negative neurological ROS     GI/Hepatic negative GI ROS, Neg liver ROS,   Endo/Other    Renal/GU Renal diseaseLab Results      Component                Value               Date                      CREATININE               0.74                07/06/2020                BUN                      13                  07/06/2020                NA                       139                 07/06/2020                K                        4.1                 07/06/2020                CL                       104                 07/06/2020                CO2                      28                  07/06/2020                Musculoskeletal negative musculoskeletal ROS (+)   Abdominal   Peds  Hematology Lab Results      Component                Value               Date                      WBC  4.4                 07/06/2020                HGB                      13.2                07/06/2020                HCT                      38.9                07/06/2020                MCV                      90.9                07/06/2020                PLT                      162                 07/06/2020              Anesthesia Other Findings Tm Sulfa, Emycin  Reproductive/Obstetrics                            Anesthesia  Physical Anesthesia Plan  ASA: 2  Anesthesia Plan: General   Post-op Pain Management:    Induction: Intravenous  PONV Risk Score and Plan: 4 or greater and Treatment may vary due to age or medical condition, Ondansetron, Midazolam and Dexamethasone  Airway Management Planned: Oral ETT  Additional Equipment: None  Intra-op Plan:   Post-operative Plan: Extubation in OR  Informed Consent: I have reviewed the patients History and Physical, chart, labs and discussed the procedure including the risks, benefits and alternatives for the proposed anesthesia with the patient or authorized representative who has indicated his/her understanding and acceptance.     Dental advisory given  Plan Discussed with: CRNA and Anesthesiologist  Anesthesia Plan Comments:        Anesthesia Quick Evaluation

## 2020-07-15 ENCOUNTER — Other Ambulatory Visit: Payer: Self-pay

## 2020-07-15 ENCOUNTER — Encounter (HOSPITAL_COMMUNITY): Payer: Self-pay | Admitting: Urology

## 2020-07-15 ENCOUNTER — Inpatient Hospital Stay (HOSPITAL_COMMUNITY): Payer: BC Managed Care – PPO | Admitting: Certified Registered Nurse Anesthetist

## 2020-07-15 ENCOUNTER — Inpatient Hospital Stay (HOSPITAL_COMMUNITY)
Admission: RE | Admit: 2020-07-15 | Discharge: 2020-07-16 | DRG: 661 | Disposition: A | Discharge status: Home / Self Care | Payer: BC Managed Care – PPO | Attending: Urology | Admitting: Urology

## 2020-07-15 ENCOUNTER — Encounter (HOSPITAL_COMMUNITY)
Admission: RE | Disposition: A | Discharge status: Home / Self Care | Payer: Self-pay | Source: Home / Self Care | Attending: Urology

## 2020-07-15 DIAGNOSIS — Z20822 Contact with and (suspected) exposure to covid-19: Secondary | ICD-10-CM | POA: Diagnosis present

## 2020-07-15 DIAGNOSIS — Z8249 Family history of ischemic heart disease and other diseases of the circulatory system: Secondary | ICD-10-CM | POA: Diagnosis not present

## 2020-07-15 DIAGNOSIS — Z888 Allergy status to other drugs, medicaments and biological substances status: Secondary | ICD-10-CM | POA: Diagnosis not present

## 2020-07-15 DIAGNOSIS — Z83438 Family history of other disorder of lipoprotein metabolism and other lipidemia: Secondary | ICD-10-CM

## 2020-07-15 DIAGNOSIS — E7800 Pure hypercholesterolemia, unspecified: Secondary | ICD-10-CM | POA: Diagnosis present

## 2020-07-15 DIAGNOSIS — Z833 Family history of diabetes mellitus: Secondary | ICD-10-CM | POA: Diagnosis not present

## 2020-07-15 DIAGNOSIS — Z79899 Other long term (current) drug therapy: Secondary | ICD-10-CM

## 2020-07-15 DIAGNOSIS — N135 Crossing vessel and stricture of ureter without hydronephrosis: Principal | ICD-10-CM | POA: Diagnosis present

## 2020-07-15 HISTORY — PX: ROBOT ASSISTED PYELOPLASTY: SHX5143

## 2020-07-15 LAB — TYPE AND SCREEN
ABO/RH(D): O POS
Antibody Screen: NEGATIVE

## 2020-07-15 LAB — CBC
HCT: 37.1 % (ref 36.0–46.0)
Hemoglobin: 12.2 g/dL (ref 12.0–15.0)
MCH: 30.9 pg (ref 26.0–34.0)
MCHC: 32.9 g/dL (ref 30.0–36.0)
MCV: 93.9 fL (ref 80.0–100.0)
Platelets: 144 10*3/uL — ABNORMAL LOW (ref 150–400)
RBC: 3.95 MIL/uL (ref 3.87–5.11)
RDW: 12.1 % (ref 11.5–15.5)
WBC: 9.5 10*3/uL (ref 4.0–10.5)
nRBC: 0 % (ref 0.0–0.2)

## 2020-07-15 LAB — CREATININE, SERUM
Creatinine, Ser: 0.99 mg/dL (ref 0.44–1.00)
GFR, Estimated: >60 mL/min (ref >60)

## 2020-07-15 LAB — ABO/RH: ABO/RH(D): O POS

## 2020-07-15 SURGERY — PYELOPLASTY, ROBOT-ASSISTED
Anesthesia: General | Site: Renal | Laterality: Left

## 2020-07-15 MED ORDER — PROPOFOL 10 MG/ML IV BOLUS
INTRAVENOUS | Status: AC
  Filled 2020-07-15: qty 20

## 2020-07-15 MED ORDER — SUGAMMADEX SODIUM 200 MG/2ML IV SOLN
INTRAVENOUS | Status: DC | PRN
  Administered 2020-07-15: 200 mg via INTRAVENOUS

## 2020-07-15 MED ORDER — SODIUM CHLORIDE 0.9 % IR SOLN
Status: DC | PRN
  Administered 2020-07-15: 1000 mL via INTRAVESICAL

## 2020-07-15 MED ORDER — ACETAMINOPHEN 10 MG/ML IV SOLN: 1000.0000 mg | Freq: Once | INTRAVENOUS | Status: DC | PRN

## 2020-07-15 MED ORDER — HYDROMORPHONE HCL 1 MG/ML IJ SOLN
INTRAMUSCULAR | Status: AC
  Administered 2020-07-15: 0.5 mg via INTRAVENOUS
  Filled 2020-07-15: qty 1

## 2020-07-15 MED ORDER — OXYCODONE HCL 5 MG/5ML PO SOLN: 5.0000 mg | Freq: Once | ORAL | Status: DC | PRN

## 2020-07-15 MED ORDER — CEPHALEXIN 500 MG PO CAPS: 500.0000 mg | ORAL_CAPSULE | Freq: Two times a day (BID) | ORAL | 0 refills | Status: AC

## 2020-07-15 MED ORDER — ROCURONIUM BROMIDE 10 MG/ML (PF) SYRINGE
PREFILLED_SYRINGE | INTRAVENOUS | Status: AC
  Filled 2020-07-15: qty 10

## 2020-07-15 MED ORDER — HYDROMORPHONE HCL 1 MG/ML IJ SOLN
0.2500 mg | INTRAMUSCULAR | Status: DC | PRN
  Administered 2020-07-15: 0.5 mg via INTRAVENOUS

## 2020-07-15 MED ORDER — LACTATED RINGERS IR SOLN
Status: DC | PRN
  Administered 2020-07-15: 1000 mL

## 2020-07-15 MED ORDER — ZOLPIDEM TARTRATE 5 MG PO TABS: 5.0000 mg | ORAL_TABLET | Freq: Every evening | ORAL | Status: DC | PRN

## 2020-07-15 MED ORDER — BUPIVACAINE LIPOSOME 1.3 % IJ SUSP
20.0000 mL | Freq: Once | INTRAMUSCULAR | Status: DC
  Filled 2020-07-15: qty 20

## 2020-07-15 MED ORDER — CHLORHEXIDINE GLUCONATE 0.12 % MT SOLN
15.0000 mL | Freq: Once | OROMUCOSAL | Status: AC
  Administered 2020-07-15: 15 mL via OROMUCOSAL

## 2020-07-15 MED ORDER — ONDANSETRON HCL 4 MG/2ML IJ SOLN
INTRAMUSCULAR | Status: AC
  Filled 2020-07-15: qty 2

## 2020-07-15 MED ORDER — ROCURONIUM BROMIDE 10 MG/ML (PF) SYRINGE
PREFILLED_SYRINGE | INTRAVENOUS | Status: DC | PRN
  Administered 2020-07-15: 20 mg via INTRAVENOUS
  Administered 2020-07-15: 10 mg via INTRAVENOUS
  Administered 2020-07-15: 20 mg via INTRAVENOUS
  Administered 2020-07-15: 50 mg via INTRAVENOUS
  Administered 2020-07-15: 10 mg via INTRAVENOUS
  Administered 2020-07-15: 20 mg via INTRAVENOUS

## 2020-07-15 MED ORDER — KETAMINE HCL 10 MG/ML IJ SOLN
INTRAMUSCULAR | Status: AC
  Filled 2020-07-15: qty 1

## 2020-07-15 MED ORDER — SODIUM CHLORIDE 0.9 % IV SOLN
2.0000 g | Freq: Once | INTRAVENOUS | Status: AC
  Administered 2020-07-15: 2 g via INTRAVENOUS
  Filled 2020-07-15: qty 2

## 2020-07-15 MED ORDER — LACTATED RINGERS IV SOLN: INTRAVENOUS | Status: DC

## 2020-07-15 MED ORDER — OXYCODONE HCL 5 MG PO TABS: 5.0000 mg | ORAL_TABLET | Freq: Once | ORAL | Status: DC | PRN

## 2020-07-15 MED ORDER — LIDOCAINE 2% (20 MG/ML) 5 ML SYRINGE
INTRAMUSCULAR | Status: AC
  Filled 2020-07-15: qty 5

## 2020-07-15 MED ORDER — ONDANSETRON HCL 4 MG/2ML IJ SOLN
4.0000 mg | INTRAMUSCULAR | Status: DC | PRN
  Administered 2020-07-15 – 2020-07-16 (×3): 4 mg via INTRAVENOUS
  Filled 2020-07-15 (×3): qty 2

## 2020-07-15 MED ORDER — KETAMINE HCL 10 MG/ML IJ SOLN
INTRAMUSCULAR | Status: DC | PRN
  Administered 2020-07-15: 40 mg via INTRAVENOUS
  Administered 2020-07-15: 10 mg via INTRAVENOUS

## 2020-07-15 MED ORDER — DOCUSATE SODIUM 100 MG PO CAPS: 100.0000 mg | ORAL_CAPSULE | Freq: Every day | ORAL | 0 refills | Status: DC | PRN

## 2020-07-15 MED ORDER — PROPOFOL 10 MG/ML IV BOLUS
INTRAVENOUS | Status: DC | PRN
  Administered 2020-07-15: 150 mg via INTRAVENOUS

## 2020-07-15 MED ORDER — FENTANYL CITRATE (PF) 250 MCG/5ML IJ SOLN
INTRAMUSCULAR | Status: AC
  Filled 2020-07-15: qty 5

## 2020-07-15 MED ORDER — ACETAMINOPHEN 325 MG PO TABS: 650.0000 mg | ORAL_TABLET | ORAL | Status: DC | PRN

## 2020-07-15 MED ORDER — OXYBUTYNIN CHLORIDE 5 MG PO TABS: 5.0000 mg | ORAL_TABLET | Freq: Three times a day (TID) | ORAL | Status: DC | PRN

## 2020-07-15 MED ORDER — HYDRALAZINE HCL 20 MG/ML IJ SOLN
INTRAMUSCULAR | Status: AC
  Filled 2020-07-15: qty 1

## 2020-07-15 MED ORDER — BISACODYL 5 MG PO TBEC: 5.0000 mg | DELAYED_RELEASE_TABLET | Freq: Every day | ORAL | Status: DC | PRN

## 2020-07-15 MED ORDER — SODIUM CHLORIDE (PF) 0.9 % IJ SOLN
INTRAMUSCULAR | Status: AC
  Filled 2020-07-15: qty 10

## 2020-07-15 MED ORDER — LIDOCAINE 2% (20 MG/ML) 5 ML SYRINGE
INTRAMUSCULAR | Status: DC | PRN
  Administered 2020-07-15: 100 mg via INTRAVENOUS

## 2020-07-15 MED ORDER — HYDROMORPHONE HCL 1 MG/ML IJ SOLN
0.5000 mg | INTRAMUSCULAR | Status: DC | PRN
  Administered 2020-07-15 – 2020-07-16 (×2): 1 mg via INTRAVENOUS
  Filled 2020-07-15 (×3): qty 1

## 2020-07-15 MED ORDER — CEFAZOLIN SODIUM-DEXTROSE 1-4 GM/50ML-% IV SOLN
1.0000 g | Freq: Three times a day (TID) | INTRAVENOUS | Status: AC
  Administered 2020-07-15 – 2020-07-16 (×3): 1 g via INTRAVENOUS
  Filled 2020-07-15 (×3): qty 50

## 2020-07-15 MED ORDER — FENTANYL CITRATE (PF) 100 MCG/2ML IJ SOLN
INTRAMUSCULAR | Status: DC | PRN
  Administered 2020-07-15: 50 ug via INTRAVENOUS
  Administered 2020-07-15: 100 ug via INTRAVENOUS
  Administered 2020-07-15 (×2): 50 ug via INTRAVENOUS

## 2020-07-15 MED ORDER — ONDANSETRON HCL 4 MG/2ML IJ SOLN
INTRAMUSCULAR | Status: DC | PRN
  Administered 2020-07-15: 4 mg via INTRAVENOUS

## 2020-07-15 MED ORDER — OXYCODONE-ACETAMINOPHEN 5-325 MG PO TABS: 1.0000 | ORAL_TABLET | ORAL | 0 refills | Status: DC | PRN

## 2020-07-15 MED ORDER — METHYLENE BLUE 0.5 % INJ SOLN
INTRAVENOUS | Status: DC | PRN
  Administered 2020-07-15: 6 mL

## 2020-07-15 MED ORDER — DEXAMETHASONE SODIUM PHOSPHATE 10 MG/ML IJ SOLN
INTRAMUSCULAR | Status: AC
  Filled 2020-07-15: qty 1

## 2020-07-15 MED ORDER — ORAL CARE MOUTH RINSE: 15.0000 mL | Freq: Once | OROMUCOSAL | Status: AC

## 2020-07-15 MED ORDER — AMISULPRIDE (ANTIEMETIC) 5 MG/2ML IV SOLN: 10.0000 mg | Freq: Once | INTRAVENOUS | Status: DC | PRN

## 2020-07-15 MED ORDER — SODIUM CHLORIDE 0.9 % IV SOLN: INTRAVENOUS | Status: DC

## 2020-07-15 MED ORDER — MIDAZOLAM HCL 2 MG/2ML IJ SOLN
INTRAMUSCULAR | Status: AC
  Filled 2020-07-15: qty 2

## 2020-07-15 MED ORDER — WATER FOR IRRIGATION, STERILE IR SOLN
Status: DC | PRN
Start: 2020-07-15 — End: 2020-07-15
  Administered 2020-07-15: 1000 mL

## 2020-07-15 MED ORDER — OXYCODONE-ACETAMINOPHEN 5-325 MG PO TABS
1.0000 | ORAL_TABLET | ORAL | Status: DC | PRN
  Administered 2020-07-15 – 2020-07-16 (×3): 2 via ORAL
  Filled 2020-07-15 (×3): qty 2

## 2020-07-15 MED ORDER — HEPARIN SODIUM (PORCINE) 5000 UNIT/ML IJ SOLN
5000.0000 [IU] | Freq: Three times a day (TID) | INTRAMUSCULAR | Status: DC
  Administered 2020-07-16 (×2): 5000 [IU] via SUBCUTANEOUS
  Filled 2020-07-15 (×2): qty 1

## 2020-07-15 MED ORDER — DIPHENHYDRAMINE HCL 12.5 MG/5ML PO ELIX: 12.5000 mg | ORAL_SOLUTION | Freq: Four times a day (QID) | ORAL | Status: DC | PRN

## 2020-07-15 MED ORDER — SODIUM CHLORIDE (PF) 0.9 % IJ SOLN
INTRAMUSCULAR | Status: AC
  Filled 2020-07-15: qty 20

## 2020-07-15 MED ORDER — MIDAZOLAM HCL 5 MG/5ML IJ SOLN
INTRAMUSCULAR | Status: DC | PRN
  Administered 2020-07-15: 2 mg via INTRAVENOUS

## 2020-07-15 MED ORDER — SODIUM CHLORIDE 0.9 % IV SOLN
INTRAVENOUS | Status: DC | PRN
  Administered 2020-07-15: 40 mL

## 2020-07-15 MED ORDER — HYDRALAZINE HCL 20 MG/ML IJ SOLN
INTRAMUSCULAR | Status: DC | PRN
  Administered 2020-07-15: 2.5 mg via INTRAVENOUS

## 2020-07-15 MED ORDER — DEXAMETHASONE SODIUM PHOSPHATE 10 MG/ML IJ SOLN
INTRAMUSCULAR | Status: DC | PRN
  Administered 2020-07-15: 10 mg via INTRAVENOUS

## 2020-07-15 MED ORDER — PHENYLEPHRINE 40 MCG/ML (10ML) SYRINGE FOR IV PUSH (FOR BLOOD PRESSURE SUPPORT)
PREFILLED_SYRINGE | INTRAVENOUS | Status: DC | PRN
  Administered 2020-07-15: 80 ug via INTRAVENOUS

## 2020-07-15 MED ORDER — METHYLENE BLUE 0.5 % INJ SOLN
INTRAVENOUS | Status: AC
  Filled 2020-07-15: qty 10

## 2020-07-15 MED ORDER — HYDROMORPHONE HCL 2 MG/ML IJ SOLN
INTRAMUSCULAR | Status: AC
  Filled 2020-07-15: qty 1

## 2020-07-15 MED ORDER — DIPHENHYDRAMINE HCL 50 MG/ML IJ SOLN: 12.5000 mg | Freq: Four times a day (QID) | INTRAMUSCULAR | Status: DC | PRN

## 2020-07-15 MED ORDER — BELLADONNA ALKALOIDS-OPIUM 16.2-60 MG RE SUPP: 1.0000 | Freq: Four times a day (QID) | RECTAL | Status: DC | PRN

## 2020-07-15 MED ORDER — HYDROMORPHONE HCL 1 MG/ML IJ SOLN
INTRAMUSCULAR | Status: DC | PRN
  Administered 2020-07-15: .4 mg via INTRAVENOUS
  Administered 2020-07-15 (×2): .5 mg via INTRAVENOUS
  Administered 2020-07-15: .2 mg via INTRAVENOUS

## 2020-07-15 MED ORDER — POLYETHYLENE GLYCOL 3350 17 G PO PACK: 17.0000 g | PACK | Freq: Every day | ORAL | Status: DC | PRN

## 2020-07-15 MED ORDER — ONDANSETRON HCL 4 MG/2ML IJ SOLN: 4.0000 mg | Freq: Once | INTRAMUSCULAR | Status: DC | PRN

## 2020-07-15 SURGICAL SUPPLY — 79 items
BAG COUNTER SPONGE SURGICOUNT (BAG) IMPLANT
BAG URO CATCHER STRL LF (MISCELLANEOUS) IMPLANT
CATH FOLEY 3WAY  5CC 16FR (CATHETERS) ×2
CATH FOLEY 3WAY 5CC 16FR (CATHETERS) ×1 IMPLANT
CATH URET 5FR 28IN OPEN ENDED (CATHETERS) ×2 IMPLANT
CHLORAPREP W/TINT 26 (MISCELLANEOUS) ×2 IMPLANT
CLIP VESOLOCK LG 6/CT PURPLE (CLIP) ×2 IMPLANT
CLIP VESOLOCK MED LG 6/CT (CLIP) IMPLANT
COVER SURGICAL LIGHT HANDLE (MISCELLANEOUS) ×2 IMPLANT
COVER TIP SHEARS 8 DVNC (MISCELLANEOUS) ×1 IMPLANT
COVER TIP SHEARS 8MM DA VINCI (MISCELLANEOUS) ×2
DECANTER SPIKE VIAL GLASS SM (MISCELLANEOUS) IMPLANT
DERMABOND ADVANCED (GAUZE/BANDAGES/DRESSINGS) ×1
DERMABOND ADVANCED .7 DNX12 (GAUZE/BANDAGES/DRESSINGS) ×1 IMPLANT
DRAIN CHANNEL 15F RND FF 3/16 (WOUND CARE) ×2 IMPLANT
DRAPE ARM DVNC X/XI (DISPOSABLE) ×4 IMPLANT
DRAPE COLUMN DVNC XI (DISPOSABLE) ×1 IMPLANT
DRAPE DA VINCI XI ARM (DISPOSABLE) ×8
DRAPE DA VINCI XI COLUMN (DISPOSABLE) ×2
DRAPE INCISE IOBAN 66X45 STRL (DRAPES) ×2 IMPLANT
DRAPE LAPAROSCOPIC ABDOMINAL (DRAPES) ×2 IMPLANT
DRAPE SHEET LG 3/4 BI-LAMINATE (DRAPES) ×2 IMPLANT
DRSG TEGADERM 4X4.75 (GAUZE/BANDAGES/DRESSINGS) ×4 IMPLANT
ELECT PENCIL ROCKER SW 15FT (MISCELLANEOUS) ×2 IMPLANT
ELECT REM PT RETURN 15FT ADLT (MISCELLANEOUS) ×2 IMPLANT
EVACUATOR SILICONE 100CC (DRAIN) ×2 IMPLANT
GLOVE SURG ENC TEXT LTX SZ6.5 (GLOVE) ×2 IMPLANT
GLOVE SURG ENC TEXT LTX SZ7.5 (GLOVE) ×8 IMPLANT
GLOVE SURG UNDER POLY LF SZ6.5 (GLOVE) ×4 IMPLANT
GLOVE SURG UNDER POLY LF SZ7 (GLOVE) ×2 IMPLANT
GOWN STRL REUS W/ TWL LRG LVL3 (GOWN DISPOSABLE) IMPLANT
GOWN STRL REUS W/TWL LRG LVL3 (GOWN DISPOSABLE) ×4 IMPLANT
GOWN STRL REUS W/TWL XL LVL3 (GOWN DISPOSABLE) ×6 IMPLANT
GUIDEWIRE STR DUAL SENSOR (WIRE) ×2 IMPLANT
IRRIG SUCT STRYKERFLOW 2 WTIP (MISCELLANEOUS) ×2
IRRIGATION SUCT STRKRFLW 2 WTP (MISCELLANEOUS) ×1 IMPLANT
IV LACTATED RINGERS 1000ML (IV SOLUTION) ×2 IMPLANT
IV NS 1000ML (IV SOLUTION) ×2
IV NS 1000ML BAXH (IV SOLUTION) ×1 IMPLANT
KIT BASIN OR (CUSTOM PROCEDURE TRAY) ×2 IMPLANT
KIT TURNOVER KIT A (KITS) ×2 IMPLANT
LOOP VESSEL MAXI BLUE (MISCELLANEOUS) IMPLANT
NDL SAFETY ECLIPSE 18X1.5 (NEEDLE) ×1 IMPLANT
NEEDLE HYPO 18GX1.5 SHARP (NEEDLE) ×2
NEEDLE INSUFFLATION 14GA 120MM (NEEDLE) ×2 IMPLANT
NS IRRIG 1000ML POUR BTL (IV SOLUTION) IMPLANT
PENCIL SMOKE EVACUATOR (MISCELLANEOUS) IMPLANT
PORT ACCESS TROCAR AIRSEAL 12 (TROCAR) ×1 IMPLANT
PORT ACCESS TROCAR AIRSEAL 5M (TROCAR) ×1
PROTECTOR NERVE ULNAR (MISCELLANEOUS) ×4 IMPLANT
SEAL CANN UNIV 5-8 DVNC XI (MISCELLANEOUS) ×4 IMPLANT
SEAL XI 5MM-8MM UNIVERSAL (MISCELLANEOUS) ×8
SET IRRIG Y TYPE TUR BLADDER L (SET/KITS/TRAYS/PACK) ×2 IMPLANT
SET TRI-LUMEN FLTR TB AIRSEAL (TUBING) ×2 IMPLANT
SOL ANTI FOG 6CC (MISCELLANEOUS) ×1 IMPLANT
SOLUTION ANTI FOG 6CC (MISCELLANEOUS) ×1
SOLUTION ELECTROLUBE (MISCELLANEOUS) ×2 IMPLANT
SPONGE T-LAP 18X18 ~~LOC~~+RFID (SPONGE) ×2 IMPLANT
SPONGE T-LAP 4X18 ~~LOC~~+RFID (SPONGE) IMPLANT
STAPLER ECHELON POWER CIR 29 (STAPLE) ×2 IMPLANT
STENT URET 6FRX26 CONTOUR (STENTS) ×2 IMPLANT
SUT ETHILON 3 0 PS 1 (SUTURE) ×2 IMPLANT
SUT MNCRL 3 0 VIOLET RB1 (SUTURE) ×2 IMPLANT
SUT MNCRL AB 4-0 PS2 18 (SUTURE) ×4 IMPLANT
SUT MONOCRYL 3 0 RB1 (SUTURE) ×4
SUT VIC AB 3-0 SH 27 (SUTURE) ×2
SUT VIC AB 3-0 SH 27XBRD (SUTURE) ×1 IMPLANT
SUT VIC AB 4-0 RB1 27 (SUTURE) ×14
SUT VIC AB 4-0 RB1 27XBRD (SUTURE) ×7 IMPLANT
SUT VLOC BARB 180 ABS3/0GR12 (SUTURE) ×2
SUTURE VLOC BRB 180 ABS3/0GR12 (SUTURE) ×1 IMPLANT
SYR 10ML LL (SYRINGE) ×2 IMPLANT
TOWEL OR 17X26 10 PK STRL BLUE (TOWEL DISPOSABLE) IMPLANT
TOWEL OR NON WOVEN STRL DISP B (DISPOSABLE) ×2 IMPLANT
TRAY FOLEY MTR SLVR 16FR STAT (SET/KITS/TRAYS/PACK) ×2 IMPLANT
TRAY LAPAROSCOPIC (CUSTOM PROCEDURE TRAY) ×2 IMPLANT
TROCAR BLADELESS OPT 5 100 (ENDOMECHANICALS) IMPLANT
TUBING CONNECTING 10 (TUBING) ×2 IMPLANT
WATER STERILE IRR 1000ML POUR (IV SOLUTION) ×2 IMPLANT

## 2020-07-15 NOTE — Anesthesia Postprocedure Evaluation (Signed)
Anesthesia Post Note  Patient: Consulting civil engineer  Procedure(s) Performed: XI ROBOTIC ASSISTED LAPAROSCOPIC  PYELOPLASTY/ LEFT STENT PLACEMENT (Left: Renal)     Anesthesia Post Evaluation No notable events documented.  Last Vitals:  Vitals:   07/15/20 1215 07/15/20 1230  BP: 104/64 117/67  Pulse: 68 65  Resp: 11 11  Temp:    SpO2: 98% 98%    Last Pain:  Vitals:   07/15/20 1215  TempSrc:   PainSc: 4                  Christie Lara

## 2020-07-15 NOTE — Op Note (Signed)
Operative Note  Preoperative diagnosis:  1.  Left ureteropelvic junction obstruction.  Postoperative diagnosis: 1.   Left ureteropelvic junction obstruction.  Procedure(s): 1.  Robotic assisted laparoscopic left dismembered pyeloplasty  Surgeon: Jettie Pagan, MD  Assistants:  Berniece Salines, MD  An assistant was required for this surgical procedure.  The duties of the assistant included but were not limited to suctioning, passing suture, camera manipulation, retraction.  This procedure would not be able to be performed without an Geophysicist/field seismologist.   Anesthesia:  General  Complications:  None  EBL:  19ml  Specimens: 1. None  Drains/Catheters: 1.  6 French by 26 cm left ureteral stent 2.  16 French three-way Foley catheter  Intraoperative findings:   Redundant left renal pelvis with kink in her proximal left ureter with subcentimeter adynamic segment of the left proximal ureter which was excised. Water tight anastomosis without tension over a 6 [place] by 26 cm left ureteral stent. Excellent hemostasis  Indication:  Lorrie Gargan is a 55 y.o. female with CT A/P 02/09/2020 revealing severe left hydronephrosis at the left ureteropelvic junction.  She was admitted with pyelonephritis in 02/2020.  After treating for infection, her pain improved.  Lasix renogram 03/30/2020 revealed left kidney function 20%, right kidney function 80%, T1 half post Lasix 25.8 minutes in the left kidney and 11.3 minutes in the right kidney.  After thorough discussion including all relevant risk benefits alternatives, she presents today for robotic assisted laparoscopic left dismembered pyeloplasty.  Description of procedure: Indications, alternatives, benefits, and risks were discussed with the patient and informed sent was obtained.  The patient was brought into the operating room table, positioned supine and secured with a safety strap.  All pressure points were carefully padded and pneumatic compression devices  were placed in the lower extremities.  After administration of intravenous antibiotics and general endotracheal anesthesia, a 16 French 3-way Foley catheter was inserted to drain the bladder.  The patient was then repositioned in the right lateral decubitus position with her left side up at a 45 degree angle with the lower leg flexed at 90 degrees and the upper leg extended. A gel roll was then positioned to protect the brachial plexus and a multiple back rolls were placed to support the back.  Left upper extremity was then placed in the left armrest.  Patient was then secured in place across the hips, chest, and legs with foam padding and cloth tape and the table flexed.  Patient was prepped and draped in standard sterile manner.  The radiographic images were in the room. A time was completed, verifying the correct patient, surgical procedure, site and positioning prior to beginning the procedure.  Pneumoperitoneum was introduced by placing a Veress needle in the abdomen and insufflating with CO2 to pressure 15 mmHg.  The camera trocar was then placed along the left lateral edge of the rectus sheath slightly cephalad to the umbilicus and a 30 degree lens was inserted under direct visitation.  Abdominal cavity was examined for any signs of injury, adhesions and identification of anatomic landmarks.  The remainder of the robotic trochars were placed along the lateral edge of the rectus sheath, which included an 8 mm port below the costal margin and one 8 mm port called to the camera port.  A separate fourth arm 8 mm port was placed laterally.  A 12 mm assistant port was placed superior to the umbilicus.  The robot was then docked.  The parietal peritoneum was incised along the white line  of Toldt and the colon was reflected medially, exposing George's fascia.  The splenorenal and splenophrenic ligaments were incised and mobilized the spleen.  The proximal ureter was identified and carefully dissected cephalad  towards the renal pelvis while preserving periureteral tissue.  The retroperitoneal fascia overlying the renal vessels was carefully separated, exposing the underlying hilum.  Of note, there was no crossing vessel identified.  She did have a kink in the most proximal aspect of the ureter.  There is a subcentimeter stenotic adynamic segment at the ureteropelvic junction.  The stenotic ureteropelvic junction segment was identified, and excised.  The ureter was widely spatulated for a distance of 2.5 cm laterally.  The renal pelvis was spatulated medially.  The tension-free anastomosis was performed starting posteriorly at the apex using interrupted 4-0 Vicryl suture.  After placement of interrupted 4-0 Vicryl sutures posteriorly, a 6 [place] by 26 cm double-J stent was advanced over a guidewire into the bladder in an antegrade fashio.  The proximal end was then placed within the left renal pelvis.  The anterior ureteropelvic anastomosis was then completed in identical fashion using interrupted 4-0 Vicryl sutures.  We did instill methylene blue into the bladder and introduced the inflow port of the Foley catheter prior to completing the anterior anastomosis noting that methylene blue was in fact refluxing up the stent and into the renal pelvis confirming presence of the distal curl within the bladder.  After completing anastomosis, there was no sign of leak.  The operative field was inspected for bleeding or injury.  The insufflation pressure was reduced, again confirming the absence of bleeding.  Pneumoperitoneum was reestablished and a surgical JP drain was placed to the fourth arm port into the perirenal space away from the repair and secured to the skin.  Overlying fat was reapproximated over the anastomosis using a running Vicryl suture.  The robot was then undocked and removed from the operative field.  The trochars were withdrawn under direct visualization.  The anterior fascia at the assistant port site was  then carefully closed with an 0 Vicryl suture on a passer.  A total of 20 mL of Exparel was injected subcutaneously at the port sites.  The skin incisions were closed with 4-0 Monocryl sutures.  At the end the procedure, all counts were correct.  The patient tolerated the procedure well and was taken to recovery in stable condition.  Plan: Patient will be admitted overnight for observation.  She will have her Foley catheter removed at 5 AM.  We will plan to evaluate drain output and remove her drain around 9 AM if there is no significant drain output at that time.  We will leave her left ureteral stent in place for about 6 weeks.  She will follow-up with me in the office to review.  Matt R. Quindarrius Joplin MD [facility]  Pager: [phone]

## 2020-07-15 NOTE — Discharge Instructions (Signed)
Activity:  You are encouraged to ambulate frequently (about every hour during waking hours) to help prevent blood clots from forming in your legs or lungs.  However, you should not engage in any heavy lifting (> 10-15 lbs), strenuous activity, or straining.  Diet: You should advance your diet as instructed by your physician.  It will be normal to have some bloating, nausea, and abdominal discomfort intermittently.  Prescriptions:  You will be provided a prescription for pain medication to take as needed.  If your pain is not severe enough to require the prescription pain medication, you may take extra strength Tylenol instead which will have less side effects.  You should also take a prescribed stool softener to avoid straining with bowel movements as the prescription pain medication may constipate you.  Incisions: You may remove your dressing bandages 48 hours after surgery if not removed in the hospital.  You will either have some small staples or special tissue glue at each of the incision sites. Once the bandages are removed (if present), the incisions may stay open to air.  You may start showering (but not soaking or bathing in water) the 2nd day after surgery and the incisions simply need to be patted dry after the shower.  No additional care is needed.  What to call us about: You should call the office [phone]) if you develop fever > 101 or develop persistent vomiting. Activity:  You are encouraged to ambulate frequently (about every hour during waking hours) to help prevent blood clots from forming in your legs or lungs.  However, you should not engage in any heavy lifting (> 10-15 lbs), strenuous activity, or straining.  You have a ureteral stent in place which will be removed in 1 month in the clinic

## 2020-07-15 NOTE — Anesthesia Procedure Notes (Signed)
Procedure Name: Intubation Date/Time: 07/15/2020 7:36 AM Performed by: Sudie Grumbling, CRNA Pre-anesthesia Checklist: Patient identified, Emergency Drugs available, Suction available and Patient being monitored Patient Re-evaluated:Patient Re-evaluated prior to induction Oxygen Delivery Method: Circle system utilized Preoxygenation: Pre-oxygenation with 100% oxygen Induction Type: IV induction Ventilation: Mask ventilation without difficulty and Oral airway inserted - appropriate to patient size Laryngoscope Size: Hyacinth Meeker and 2 Grade View: Grade III Tube type: Oral Number of attempts: 2 Airway Equipment and Method: Stylet, Oral airway and Bougie stylet Placement Confirmation: ETT inserted through vocal cords under direct vision, positive ETCO2 and breath sounds checked- equal and bilateral Secured at: 22 cm Tube secured with: Tape Dental Injury: Teeth and Oropharynx as per pre-operative assessment  Difficulty Due To: Difficulty was unanticipated and Difficult Airway- due to anterior larynx Comments: DL miller 2, grade III view, Dr. Richardson Landry took a look and Bougie used to advance ETT.

## 2020-07-15 NOTE — Plan of Care (Signed)
  Problem: Activity: Goal: Risk for activity intolerance will decrease Outcome: Progressing   Problem: Pain Managment: Goal: General experience of comfort will improve Outcome: Progressing   Problem: Safety: Goal: Ability to remain free from injury will improve Outcome: Progressing   

## 2020-07-15 NOTE — H&P (Signed)
Christie Lara is a 55 year old female seen in followup today for a left ureteropelvic junction obstruction.   She presented to the ED on 02/09/2020 with complaints of fever to 102.5, tachycardia, left abdominal discomfort, nausea and emesis. CT A/P 02/09/2020 revealed severe left hydronephrosis to the level near the left ureteropelvic junction. She was found to have no stones or obstructing lesion. She was admitted for 3 days with left-sided pyelonephritis. Her last creatinine on 02/12/2020 was 1.04. Urine culture 02/09/2020 resulted greater than 100,000 pansensitive E. coli.   She has been intermittently symptomatic. When I saw her on 03/09/2020 she had no flank pain however saw on 03/07/2014/2022 with increased left-sided flank pain which has improved. Other than recent episode of pyelonephritis, she was treated outpatient for pyelonephritis approximately 30 years ago.   She denies recent fevers or chills. She denies dysuria. She denies urinary frequency or urgency. She denies bothersome lower urinary tract symptoms. She denies gross hematuria. She denies a family history of urologic malignancy.   Lasix renogram 03/30/2020 revealed left kidney function 20%, right kidney function 80%, T1 half post Lasix 25.8 minutes in the left kidney 11.3 minutes in the right kidney. There is evidence of markedly dilated left renal collecting system however adequate washout of tracer over the course of the study.   Patient currently denies fever, chills, sweats, nausea, vomiting, gross hematuria or dysuria.     ALLERGIES: Erythromycin Sulfamethoxazole-Trimethoprim    MEDICATIONS: Calcium  Claritin  Claritin-D 12 Hour  Cranberry  Fluoxetine Hcl 20 mg capsule  Fluticasone Propionate  Rosuvastatin Calcium     GU PSH: None   NON-GU PSH: Hernia Repair - 1970     GU PMH: Hydronephrosis - 03/16/2020, - 03/09/2020 Urinary Obstruction - 03/16/2020, - 03/09/2020    NON-GU PMH: Anxiety Hypercholesterolemia    FAMILY  HISTORY: 2 sons - Runs in Family Diabetes - Father Hypercholesterolemia - Mother Hypertension - Father skin cancer - Father   SOCIAL HISTORY: Marital Status: Married Ethnicity: Not Hispanic Or Latino; Race: White Current Smoking Status: Patient has never smoked.   Tobacco Use Assessment Completed: Used Tobacco in last 30 days? Does drink.  Drinks 3 caffeinated drinks per day.    REVIEW OF SYSTEMS:    GU Review Female:   Patient denies frequent urination, hard to postpone urination, burning /pain with urination, get up at night to urinate, leakage of urine, stream starts and stops, trouble starting your stream, have to strain to urinate, and being pregnant.  Gastrointestinal (Upper):   Patient denies nausea, vomiting, and indigestion/ heartburn.  Gastrointestinal (Lower):   Patient denies diarrhea and constipation.  Constitutional:   Patient denies fever, fatigue, weight loss, and night sweats.  Skin:   Patient denies skin rash/ lesion and itching.  Eyes:   Patient denies blurred vision and double vision.  Ears/ Nose/ Throat:   Patient denies sore throat and sinus problems.  Hematologic/Lymphatic:   Patient denies swollen glands and easy bruising.  Cardiovascular:   Patient denies leg swelling and chest pains.  Respiratory:   Patient denies cough and shortness of breath.  Endocrine:   Patient denies excessive thirst.  Musculoskeletal:   Patient denies back pain and joint pain.  Neurological:   Patient denies headaches and dizziness.  Psychologic:   Patient denies depression and anxiety.   VITAL SIGNS:      04/07/2020 01:46 PM  Weight 175 lb / 79.38 kg  Height 66.5 in / 168.91 cm  BP 105/74 mmHg  Pulse 70 /min  Temperature 97.1 F / 36.1 C  BMI 27.8 kg/m   MULTI-SYSTEM PHYSICAL EXAMINATION:    Constitutional: Well-nourished. No physical deformities. Normally developed. Good grooming.  Respiratory: No labored breathing, no use of accessory muscles.   Cardiovascular: Normal  temperature, normal extremity pulses, no swelling, no varicosities.  Gastrointestinal: No mass, no tenderness, no rigidity, non obese abdomen.     Complexity of Data:  Source Of History:  Patient  Urine Test Review:   Urinalysis  X-Ray Review: C.T. Abdomen/Pelvis: Reviewed Films. Reviewed Report. Discussed With Patient.  Lasix Renogram: Reviewed Films. Reviewed Report. Discussed With Patient.    Notes:                     CLINICAL DATA: LEFT hydronephrosis and UPJ obstruction   EXAM:  NUCLEAR MEDICINE RENAL SCAN WITH DIURETIC ADMINISTRATION   TECHNIQUE:  Radionuclide angiographic and sequential renal images were obtained  after intravenous injection of radiopharmaceutical. Imaging was  continued during slow intravenous injection of Lasix approximately  15 minutes after the start of the examination.   RADIOPHARMACEUTICALS: 5.1 mCi Technetium-76m MAG3 IV   Pharmaceutical: Lasix 40 mg IV   COMPARISON: CT abdomen and pelvis 02/09/2020   FINDINGS:  Flow: Slightly delayed blood flow to LEFT kidney versus RIGHT.   Left renogram: Delayed uptake, concentration and excretion of tracer  by LEFT kidney. Delayed excretion into a dilated LEFT renal  collecting system. Slow clearance of tracer from the LEFT kidney  with residual tracer in the dilated collecting system at the  conclusion of the exam. Analysis of the renogram curve demonstrates  a borderline delayed time to peak activity of 6.2 minutes with fall  to half maximum activity 28.9 minutes later.   Right renogram: Normal uptake, concentration and excretion of tracer  by RIGHT kidney. Good clearance of tracer before and continuing  following Lasix administration. No abnormal tracer retention at the  conclusion of the exam. Analysis of the renogram curve demonstrates  a normal time to peak activity of 2.7 minutes with fall to half  maximum activity 6.9 minutes later.   Differential:   Left kidney = 20 %   Right kidney = 80 %    T1/2 post Lasix :   Left kidney = 25.8 min   Right kidney = 11.3 min   IMPRESSION:  Markedly dilated LEFT renal collecting system though there is  adequate washout of tracer over the course of the study.   Normal RIGHT renogram.   Markedly asymmetric renal function, 80% RIGHT versus 20% LEFT.    Electronically Signed  By: Ulyses Southward M.D.  On: 03/30/2020 15:37   CLINICAL DATA: Lower abdominal pain, fever, nausea and dysuria  since yesterday   EXAM:  CT ABDOMEN AND PELVIS WITHOUT CONTRAST   TECHNIQUE:  Multidetector CT imaging of the abdomen and pelvis was performed  following the standard protocol without IV contrast.   COMPARISON: None   FINDINGS:  Lower chest: Lung bases are clear. Normal heart size. No pericardial  effusion.   Hepatobiliary: No visible focal liver lesion with limitations of  this unenhanced CT. Smooth liver surface contour. Normal liver  attenuation. Normal gallbladder and biliary tree without visible  calcified gallstone.   Pancreas: No pancreatic ductal dilatation or surrounding  inflammatory changes.   Spleen: Normal in size. No concerning splenic lesions.   Adrenals/Urinary Tract: Normal adrenal glands. Extensive left  perinephric stranding and free fluid with severe  hydroureteronephrosis to the level of an abrupt transition in  the  proximal near the ureteropelvic junction. No clear obstructive  calculus is seen. No discernible obstructing lesion. Mild urothelial  thickening is noted. Additional circumferential thickening of the  urinary bladder is seen. No right urinary tract dilatation or  perinephric stranding. No concerning focal renal lesion.   Stomach/Bowel: Small sliding-type hiatal hernia. Distal stomach is  unremarkable. Tiny air-filled duodenal diverticulum (2/40), no  inflammation. Duodenum with a normal sweep across the midline  abdomen. No small bowel thickening or dilatation. Normal appendix in  the right lower  quadrant. No colonic dilatation or wall thickening.   Vascular/Lymphatic: Atherosclerotic calcifications within the  abdominal aorta and branch vessels. No aneurysm or ectasia. No  enlarged abdominopelvic lymph nodes.   Reproductive: Anteverted uterus. No concerning adnexal lesions are  seen.   Other: Extensive perinephric stranding with small amount of  retroperitoneal free fluid additional small volume low-attenuation  fluid is seen in the deep pelvis as well. No free air. No bowel  containing hernias.   Musculoskeletal: No acute osseous abnormality or suspicious osseous  lesion. Multilevel degenerative changes are present in the imaged  portions of the spine. Features most pronounced at L4-5 with some  more sclerotic Modic type endplate changes with Schmorl's node  formations. Additional degenerative changes in the hips and pelvis.   IMPRESSION:  1. Extensive left perinephric stranding and free fluid with severe  hydronephrosis and dilatation to the level of an abrupt transition  near the ureteropelvic junction. No obstructive calculus nor  discernible obstructing lesion is seen. Mild urothelial thickening  is noted with circumferential thickening of the urinary bladder wall  with mild urothelial thickening, suggestive of a concomitant  ascending tract infection well particularly given the patient's  urinalysis. Consider emergent urologic consultation.  2. Small sliding-type hiatal hernia.  3. Anteverted uterus.  4. Aortic Atherosclerosis (ICD10-I70.0).   These results were called by telephone at the time of interpretation  on 02/09/2020 at 9:51 pm to provider ADAM CURATOLO , who verbally  acknowledged these results.    Electronically Signed  By: Kreg Shropshire M.D.  On: 02/09/2020 21:52   PROCEDURES:          Urinalysis w/Scope Dipstick Dipstick Cont'd Micro  Color: Yellow Bilirubin: Neg mg/dL WBC/hpf: 0 - 5/hpf  Appearance: Clear Ketones: Neg mg/dL RBC/hpf: NS (Not  Seen)  Specific Gravity: 1.010 Blood: Neg ery/uL Bacteria: NS (Not Seen)  pH: 6.0 Protein: Neg mg/dL Cystals: NS (Not Seen)  Glucose: Neg mg/dL Urobilinogen: 0.2 mg/dL Casts: NS (Not Seen)    Nitrites: Neg Trichomonas: Not Present    Leukocyte Esterase: Trace leu/uL Mucous: Not Present      Epithelial Cells: 0 - 5/hpf      Yeast: NS (Not Seen)      Sperm: Not Present    ASSESSMENT:      ICD-10 Details  1 GU:   Hydronephrosis - N13.0   2   Urinary Obstruction - N13.8    PLAN:           Schedule Return Visit/Planned Activity: Return PRN - Office Visit          Document Letter(s):  Created for Patient: Clinical Summary         Notes:   #1. Left ureteropelvic junction obstruction:  -CT A/P 02/09/2020 with severe left hydronephrosis near the left UPJ. No calculi identified. Possible crossing left renal vessel however unable to completely evaluate with noncontrast study.  -Reviewed Lasix renogram with patient today with marked asymmetric renal function with  20% in the left and 80% on the right. T 1/2 of the left was 25.8 minutes post Lasix.  -I discussed findings and discussed that as the renal function is greater than 10%, I do think that a reconstructive procedure of a left robotic assisted laparoscopic pyeloplasty could salvage her remaining left-sided renal function. We discussed risks and benefits of this operation. We discussed that if she were to avoid this operation, she could have worsening function of her left kidney.  -She will consider the options and call us if she wishes to schedule left robotic assisted laparoscopic pyeloplasty, left ureteral stent placement  -If she does decide to schedule, we will obtain CT abdomen with contrast in order to evaluate for crossing vessel.   Cc: Georgette Shell, PA   Urology Preoperative H&P   Chief Complaint: Left UPJ obstruction  History of Present Illness: Christie Lara is a 55 y.o. female with a left UPJ obstruction here for left  robotic assisted laparoscopic pyeloplasty. Denies fevers, chills, dysuria.  History reviewed. No pertinent past medical history.  Past Surgical History:  Procedure Laterality Date   HERNIA REPAIR     WISDOM TOOTH EXTRACTION      Allergies:  Allergies  Allergen Reactions   Sulfamethoxazole-Trimethoprim Nausea And Vomiting and Nausea Only    Other reaction(s): Abdominal Pain, Fever (intolerance)   Elemental Sulfur    Sulfa Antibiotics    Erythromycin Nausea And Vomiting and Nausea Only    Family History  Problem Relation Age of Onset   Hyperlipidemia Mother    Diabetes Father    Hypertension Father    Cancer Father     Social History:  reports that she has never smoked. She has never used smokeless tobacco. She reports current alcohol use. She reports that she does not use drugs.  ROS: A complete review of systems was performed.  All systems are negative except for pertinent findings as noted.  Physical Exam:  Vital signs in last 24 hours: Temp:  [98.2 F (36.8 C)] 98.2 F (36.8 C) (07/15 0547) Pulse Rate:  [65] 65 (07/15 0547) Resp:  [16] 16 (07/15 0547) BP: (110)/(79) 110/79 (07/15 0547) SpO2:  [97 %] 97 % (07/15 0547) Weight:  [81.2 kg] 81.2 kg (07/15 0547) Constitutional:  Alert and oriented, No acute distress Cardiovascular: Regular rate and rhythm Respiratory: Normal respiratory effort, Lungs clear bilaterally GI: Abdomen is soft, nontender, nondistended, no abdominal masses GU: No CVA tenderness Lymphatic: No lymphadenopathy Neurologic: Grossly intact, no focal deficits Psychiatric: Normal mood and affect  Laboratory Data:  No results for input(s): WBC, HGB, HCT, PLT in the last 72 hours.  No results for input(s): NA, K, CL, GLUCOSE, BUN, CALCIUM, CREATININE in the last 72 hours.  Invalid input(s): CO3   Results for orders placed or performed during the hospital encounter of 07/15/20 (from the past 24 hour(s))  ABO/Rh     Status: None   Collection  Time: 07/15/20  6:00 AM  Result Value Ref Range   ABO/RH(D)      O POS Performed at [facility], [address], [place], [place] [address]    Recent Results (from the past 240 hour(s))  SARS CORONAVIRUS 2 (TAT 6-24 HRS) Nasopharyngeal Nasopharyngeal Swab     Status: None   Collection Time: 07/12/20  2:45 PM   Specimen: Nasopharyngeal Swab  Result Value Ref Range Status   SARS Coronavirus 2 NEGATIVE NEGATIVE Final    Comment: (NOTE) SARS-CoV-2 target nucleic acids are NOT DETECTED.  The SARS-CoV-2 RNA is generally detectable in upper and lower respiratory specimens during the acute phase of infection. Negative results do not preclude SARS-CoV-2 infection, do not rule out co-infections with other pathogens, and should not be used as the sole basis for treatment or other patient management decisions. Negative results must be combined with clinical observations, patient history, and epidemiological information. The expected result is Negative.  Fact Sheet for Patients: HairSlick.nohttps://www.fda.gov/media/138098/download  Fact Sheet for Healthcare Providers: quierodirigir.comhttps://www.fda.gov/media/138095/download  This test is not yet approved or cleared by the [place] FDA and  has been authorized for detection and/or diagnosis of SARS-CoV-2 by FDA under an Emergency Use Authorization (EUA). This EUA will remain  in effect (meaning this test can be used) for the duration of the COVID-19 declaration under Se ction 564(b)(1) of the Act, 21 U.S.C. section 360bbb-3(b)(1), unless the authorization is terminated or revoked sooner.  Performed at [facility], [address] [place], [place] [address]     Renal Function: No results for input(s): CREATININE in the last 168 hours. Estimated Creatinine Clearance: 89.3 mL/min (by C-G formula based on SCr of 0.74 mg/dL).  Radiologic Imaging: No results found.  I independently reviewed the above imaging  studies.  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 female with left UPJ obstruction here for left robotic assisted laparoscopic pyeloplasty. Denies fevers, chills, dysuria. Ok to proceed.   We have discussed the risks of treatment in detail including but not limited to bleeding, infection, heart attack, stroke, death, venothromoboembolism, injury/damage to surrounding organs and structures, urine leak, the possibility of open surgical conversion for patients undergoing minimally invasive surgery, the risk of developing chronic kidney disease and its associated implications.  Matt R. Ruthmary Occhipinti MD 07/15/2020, 7:17 AM  [facility] Pager: [phone]

## 2020-07-15 NOTE — Progress Notes (Signed)
Day of Surgery Subjective: Pain controlled. Some nausea, no emesis. Afebrile. Tolerating foley.  Objective: Vital signs in last 24 hours: Temp:  [97.7 F (36.5 C)-98.2 F (36.8 C)] 97.7 F (36.5 C) (07/15 1303) Pulse Rate:  [65-77] 69 (07/15 1303) Resp:  [9-20] 20 (07/15 1303) BP: (103-125)/(64-79) 115/73 (07/15 1303) SpO2:  [90 %-100 %] 99 % (07/15 1303) Weight:  [81.2 kg] 81.2 kg (07/15 0547)  Intake/Output from previous day: No intake/output data recorded. Intake/Output this shift: Total I/O In: 1415 [I.V.:1315; IV Piggyback:100] Out: 835 [Urine:775; Drains:10; Blood:50]  Physical Exam:  General: Alert and oriented CV: RRR Lungs: Clear Abdomen: Soft, ND, ATTP; inc c/d/I; JP serous Ext: NT, No erythema GU: clear yellow  Lab Results: No results for input(s): HGB, HCT in the last 72 hours. BMET No results for input(s): NA, K, CL, CO2, GLUCOSE, BUN, CREATININE, CALCIUM in the last 72 hours.   Studies/Results: No results found.  Assessment/Plan: L UPJ obstruction s/p RAL pyeloplasty 07/15/2020  -Pain control prn -Clears, advance as tolerated -Zofran for nausea -Labs in AM -VT at 5AM scheduled -Check JP cr at 9AM (ordered). Remove if consistent with serum and no significant output. -OOB, amb, IS, SQH -Leave stent for 6 weeks. -Anticipate discharge home tomorrow   LOS: 0 days   Matt R. Brice Kossman MD 07/15/2020, 5:16 PM [facility]  Pager: [phone]

## 2020-07-15 NOTE — Transfer of Care (Signed)
Immediate Anesthesia Transfer of Care Note  Patient: Christie Lara  Procedure(s) Performed: XI ROBOTIC ASSISTED LAPAROSCOPIC  PYELOPLASTY/ LEFT STENT PLACEMENT (Left: Renal)  Patient Location: PACU  Anesthesia Type:General  Level of Consciousness: awake, alert , oriented and patient cooperative  Airway & Oxygen Therapy: Patient Spontanous Breathing and Patient connected to face mask oxygen  Post-op Assessment: Report given to RN, Post -op Vital signs reviewed and stable and Patient moving all extremities  Post vital signs: Reviewed and stable  Last Vitals:  Vitals Value Taken Time  BP 125/75 07/15/20 1130  Temp    Pulse 81 07/15/20 1131  Resp 15 07/15/20 1131  SpO2 99 % 07/15/20 1131  Vitals shown include unvalidated device data.  Last Pain:  Vitals:   07/15/20 0547  TempSrc: Oral  PainSc: 0-No pain      Patients Stated Pain Goal: 3 (07/15/20 0547)  Complications: No notable events documented.

## 2020-07-16 ENCOUNTER — Encounter (HOSPITAL_COMMUNITY): Payer: Self-pay | Admitting: Urology

## 2020-07-16 LAB — CBC WITH DIFFERENTIAL/PLATELET
Abs Immature Granulocytes: 0.05 10*3/uL (ref 0.00–0.07)
Basophils Absolute: 0 10*3/uL (ref 0.0–0.1)
Basophils Relative: 0 %
Eosinophils Absolute: 0 10*3/uL (ref 0.0–0.5)
Eosinophils Relative: 0 %
HCT: 35.7 % — ABNORMAL LOW (ref 36.0–46.0)
Hemoglobin: 11.7 g/dL — ABNORMAL LOW (ref 12.0–15.0)
Immature Granulocytes: 1 %
Lymphocytes Relative: 8 %
Lymphs Abs: 0.7 10*3/uL (ref 0.7–4.0)
MCH: 31 pg (ref 26.0–34.0)
MCHC: 32.8 g/dL (ref 30.0–36.0)
MCV: 94.4 fL (ref 80.0–100.0)
Monocytes Absolute: 0.7 10*3/uL (ref 0.1–1.0)
Monocytes Relative: 8 %
Neutro Abs: 7.5 10*3/uL (ref 1.7–7.7)
Neutrophils Relative %: 83 %
Platelets: 145 10*3/uL — ABNORMAL LOW (ref 150–400)
RBC: 3.78 MIL/uL — ABNORMAL LOW (ref 3.87–5.11)
RDW: 12.1 % (ref 11.5–15.5)
WBC: 9 10*3/uL (ref 4.0–10.5)
nRBC: 0 % (ref 0.0–0.2)

## 2020-07-16 LAB — BASIC METABOLIC PANEL
Anion gap: 4 — ABNORMAL LOW (ref 5–15)
BUN: 10 mg/dL (ref 6–20)
CO2: 29 mmol/L (ref 22–32)
Calcium: 8.6 mg/dL — ABNORMAL LOW (ref 8.9–10.3)
Chloride: 107 mmol/L (ref 98–111)
Creatinine, Ser: 0.9 mg/dL (ref 0.44–1.00)
GFR, Estimated: >60 mL/min (ref >60)
Glucose, Bld: 106 mg/dL — ABNORMAL HIGH (ref 70–99)
Potassium: 4.3 mmol/L (ref 3.5–5.1)
Sodium: 140 mmol/L (ref 135–145)

## 2020-07-16 LAB — CREATININE, FLUID (PLEURAL, PERITONEAL, JP DRAINAGE): Creat, Fluid: 0.9 mg/dL

## 2020-07-16 NOTE — Progress Notes (Signed)
Pt ambulated on hall twice this time, tolerated well.

## 2020-07-16 NOTE — Progress Notes (Signed)
Urology Inpatient Progress Report    Intv/Subj: No overnight events.  Patient has ambulated and pain is controlled with oral medication.  Active Problems:   UPJ obstruction, acquired  Current Facility-Administered Medications  Medication Dose Route Frequency Provider Last Rate Last Admin   0.9 %  sodium chloride infusion   Intravenous Continuous Jannifer Hick, MD 100 mL/hr at 07/16/20 0104 New Bag at 07/16/20 0104   acetaminophen (TYLENOL) tablet 650 mg  650 mg Oral Q4H PRN Jannifer Hick, MD       belladonna-opium (B&O) suppository 16.2-60mg   1 suppository Rectal Q6H PRN Jannifer Hick, MD       bisacodyl (DULCOLAX) EC tablet 5 mg  5 mg Oral Daily PRN Jannifer Hick, MD       diphenhydrAMINE (BENADRYL) injection 12.5-25 mg  12.5-25 mg Intravenous Q6H PRN Jannifer Hick, MD       Or   diphenhydrAMINE (BENADRYL) 12.5 MG/5ML elixir 12.5-25 mg  12.5-25 mg Oral Q6H PRN Jannifer Hick, MD       heparin injection 5,000 Units  5,000 Units Subcutaneous Q8H Jannifer Hick, MD   5,000 Units at 07/16/20 4696   HYDROmorphone (DILAUDID) injection 0.5-1 mg  0.5-1 mg Intravenous Q2H PRN Jannifer Hick, MD   1 mg at 07/16/20 0445   ondansetron (ZOFRAN) injection 4 mg  4 mg Intravenous Q4H PRN Jannifer Hick, MD   4 mg at 07/16/20 0612   oxybutynin (DITROPAN) tablet 5 mg  5 mg Oral Q8H PRN Jannifer Hick, MD       oxyCODONE-acetaminophen (PERCOCET/ROXICET) 5-325 MG per tablet 1-2 tablet  1-2 tablet Oral Q4H PRN Jannifer Hick, MD   2 tablet at 07/16/20 2952   polyethylene glycol (MIRALAX / GLYCOLAX) packet 17 g  17 g Oral Daily PRN Jannifer Hick, MD       zolpidem [facility]) tablet 5 mg  5 mg Oral QHS PRN Jannifer Hick, MD         Objective: Vital: Vitals:   07/15/20 1303 07/15/20 2130 07/16/20 0104 07/16/20 0610  BP: 115/73 106/80 (!) 111/57 111/68  Pulse: 69 71 72 64  Resp: 20 16 16 15   Temp: 97.7 F (36.5 C) 98.3 F (36.8 C) 98.5 F (36.9 C) 98.2 F (36.8 C)  TempSrc: Oral Oral Oral Oral   SpO2: 99% 97% 93% 94%  Weight:      Height:       I/Os: I/O last 3 completed shifts: In: 1558.9 [I.V.:1408.9; IV Piggyback:150] Out: 835 [Urine:775; Drains:10; Blood:50]  Physical Exam:  General: Patient is in no apparent distress Lungs: Normal respiratory effort, chest expands symmetrically. GI: Incisions are c/d/i.  The abdomen is soft and appropriately ttp JP drain with serosanguinous drainage (10/50) Foley: has been removed earlier today  Ext: lower extremities symmetric  Lab Results: Recent Labs    07/15/20 2252 07/16/20 0434  WBC 9.5 9.0  HGB 12.2 11.7*  HCT 37.1 35.7*   Recent Labs    07/15/20 2252 07/16/20 0434  NA  --  140  K  --  4.3  CL  --  107  CO2  --  29  GLUCOSE  --  106*  BUN  --  10  CREATININE 0.99 0.90  CALCIUM  --  8.6*   No results for input(s): LABPT, INR in the last 72 hours. No results for input(s): LABURIN in the last 72 hours. Results for orders placed or performed during the hospital  encounter of 07/12/20  SARS CORONAVIRUS 2 (TAT 6-24 HRS) Nasopharyngeal Nasopharyngeal Swab     Status: None   Collection Time: 07/12/20  2:45 PM   Specimen: Nasopharyngeal Swab  Result Value Ref Range Status   SARS Coronavirus 2 NEGATIVE NEGATIVE Final    Comment: (NOTE) SARS-CoV-2 target nucleic acids are NOT DETECTED.  The SARS-CoV-2 RNA is generally detectable in upper and lower respiratory specimens during the acute phase of infection. Negative results do not preclude SARS-CoV-2 infection, do not rule out co-infections with other pathogens, and should not be used as the sole basis for treatment or other patient management decisions. Negative results must be combined with clinical observations, patient history, and epidemiological information. The expected result is Negative.  Fact Sheet for Patients: HairSlick.no  Fact Sheet for Healthcare Providers: quierodirigir.com  This test is not  yet approved or cleared by the [place] FDA and  has been authorized for detection and/or diagnosis of SARS-CoV-2 by FDA under an Emergency Use Authorization (EUA). This EUA will remain  in effect (meaning this test can be used) for the duration of the COVID-19 declaration under Se ction 564(b)(1) of the Act, 21 U.S.C. section 360bbb-3(b)(1), unless the authorization is terminated or revoked sooner.  Performed at [facility], [address], [place], [place] [address]     Studies/Results: No results found.  Assessment: 55 year old woman with hx of L UPJ obstruction POD#1 Robotic pyeloplasty.   -continue ambulation -foley has been removed -advance diet -home meds -PO pain meds -d/c JP later today -likely d/c home today  LOS: 1 day   Kasandra Knudsen, MD Urology 07/16/2020, 6:46 AM

## 2020-07-16 NOTE — Discharge Summary (Signed)
Date of admission: 07/15/2020  Date of discharge: 07/16/2020  Admission diagnosis: left UPJ obstruction  Discharge diagnosis: same  Secondary diagnoses:  Patient Active Problem List   Diagnosis Date Noted   UPJ obstruction, acquired 07/15/2020   Sepsis ([place]) 02/09/2020   Hydronephrosis of left kidney 02/09/2020   Acute pyelonephritis 02/09/2020   ALLERGIC RHINITIS 12/15/2009    Procedures performed: Procedure(s): XI ROBOTIC ASSISTED LAPAROSCOPIC  PYELOPLASTY/ LEFT STENT PLACEMENT  History and Physical: For full details, please see admission history and physical. Briefly, Christie Lara is a 55 y.o. year old patient with left UPJ obstruction.   Hospital Course: Patient tolerated the procedure well.  She was then transferred to the floor after an uneventful PACU stay.  Her hospital course was uncomplicated.  On POD#1 she had met discharge criteria: was eating a regular diet, was up and ambulating independently,  pain was well controlled, was voiding without a catheter, and was ready to for discharge.   Laboratory values:  Recent Labs    07/15/20 2252 07/16/20 0434  WBC 9.5 9.0  HGB 12.2 11.7*  HCT 37.1 35.7*   Recent Labs    07/15/20 2252 07/16/20 0434  NA  --  140  K  --  4.3  CL  --  107  CO2  --  29  GLUCOSE  --  106*  BUN  --  10  CREATININE 0.99 0.90  CALCIUM  --  8.6*   No results for input(s): LABPT, INR in the last 72 hours. No results for input(s): LABURIN in the last 72 hours. Results for orders placed or performed during the hospital encounter of 07/12/20  SARS CORONAVIRUS 2 (TAT 6-24 HRS) Nasopharyngeal Nasopharyngeal Swab     Status: None   Collection Time: 07/12/20  2:45 PM   Specimen: Nasopharyngeal Swab  Result Value Ref Range Status   SARS Coronavirus 2 NEGATIVE NEGATIVE Final    Comment: (NOTE) SARS-CoV-2 target nucleic acids are NOT DETECTED.  The SARS-CoV-2 RNA is generally detectable in upper and lower respiratory specimens during the  acute phase of infection. Negative results do not preclude SARS-CoV-2 infection, do not rule out co-infections with other pathogens, and should not be used as the sole basis for treatment or other patient management decisions. Negative results must be combined with clinical observations, patient history, and epidemiological information. The expected result is Negative.  Fact Sheet for Patients: SugarRoll.be  Fact Sheet for Healthcare Providers: https://www.woods-mathews.com/  This test is not yet approved or cleared by the [place] FDA and  has been authorized for detection and/or diagnosis of SARS-CoV-2 by FDA under an Emergency Use Authorization (EUA). This EUA will remain  in effect (meaning this test can be used) for the duration of the COVID-19 declaration under Se ction 564(b)(1) of the Act, 21 U.S.C. section 360bbb-3(b)(1), unless the authorization is terminated or revoked sooner.  Performed at [facility], [place] [address] [place], [place] [address]     Disposition: Home  Discharge instruction: The patient was instructed to be ambulatory but told to refrain from heavy lifting, strenuous activity, or driving.   Discharge medications:  Allergies as of 07/16/2020       Reactions   Sulfamethoxazole-trimethoprim Nausea And Vomiting, Nausea Only   Other reaction(s): Abdominal Pain, Fever (intolerance)   Elemental Sulfur    Sulfa Antibiotics    Erythromycin Nausea And Vomiting, Nausea Only        Medication List     TAKE these medications  cephALEXin 500 MG capsule Commonly known as: KEFLEX Take 1 capsule (500 mg total) by mouth 2 (two) times daily for 6 doses. Take the day prior, day of and day after stent removal in the office   docusate sodium 100 MG capsule Commonly known as: Colace Take 1 capsule (100 mg total) by mouth daily as needed for up to 30 doses.   FLUoxetine 20 MG tablet Commonly known as:  PROZAC Take 1 tablet (20 mg total) by mouth daily.   loratadine 10 MG tablet Commonly known as: CLARITIN Take 10 mg by mouth daily.   meloxicam 7.5 MG tablet Commonly known as: MOBIC Take 7.5 mg by mouth daily.   oxyCODONE-acetaminophen 5-325 MG tablet Commonly known as: Percocet Take 1 tablet by mouth every 4 (four) hours as needed for up to 20 doses for severe pain.   rosuvastatin 10 MG tablet Commonly known as: CRESTOR Take 10 mg by mouth at bedtime.        Followup:   Follow-up Information     [facility] Follow up on 07/28/2020.   Why: 8AM Contact information: [place] [phone]

## 2020-07-23 ENCOUNTER — Encounter (HOSPITAL_BASED_OUTPATIENT_CLINIC_OR_DEPARTMENT_OTHER): Payer: Self-pay | Admitting: Emergency Medicine

## 2020-07-23 ENCOUNTER — Emergency Department (HOSPITAL_BASED_OUTPATIENT_CLINIC_OR_DEPARTMENT_OTHER)
Admission: EM | Admit: 2020-07-23 | Discharge: 2020-07-23 | Disposition: A | Discharge status: Home / Self Care | Payer: BC Managed Care – PPO | Attending: Emergency Medicine | Admitting: Emergency Medicine

## 2020-07-23 ENCOUNTER — Other Ambulatory Visit: Payer: Self-pay

## 2020-07-23 DIAGNOSIS — R300 Dysuria: Secondary | ICD-10-CM | POA: Diagnosis present

## 2020-07-23 DIAGNOSIS — Z960 Presence of urogenital implants: Secondary | ICD-10-CM | POA: Diagnosis not present

## 2020-07-23 DIAGNOSIS — N390 Urinary tract infection, site not specified: Secondary | ICD-10-CM | POA: Diagnosis not present

## 2020-07-23 LAB — URINALYSIS, ROUTINE W REFLEX MICROSCOPIC
Bilirubin Urine: NEGATIVE
Glucose, UA: NEGATIVE mg/dL
Ketones, ur: NEGATIVE mg/dL
Nitrite: NEGATIVE
Protein, ur: 100 mg/dL — AB
Specific Gravity, Urine: <1.005 — ABNORMAL LOW (ref 1.005–1.030)
WBC, UA: >50 WBC/hpf — ABNORMAL HIGH (ref 0–5)
pH: 6.5 (ref 5.0–8.0)

## 2020-07-23 MED ORDER — FLUCONAZOLE 150 MG PO TABS: ORAL_TABLET | ORAL | 0 refills | Status: DC

## 2020-07-23 MED ORDER — CEFPODOXIME PROXETIL 200 MG PO TABS: 200.0000 mg | ORAL_TABLET | Freq: Two times a day (BID) | ORAL | 0 refills | Status: DC

## 2020-07-23 NOTE — ED Triage Notes (Signed)
  Patient comes in with possible UTI.  Patient woke up to urinate and felt a burning sensation so she wanted to be checked for UTI.  Patient had a pyeloplasty performed on 7/15 with stent placement.  No other complaints at this time.  Pain 3/10, burning when urinating.

## 2020-07-23 NOTE — ED Provider Notes (Signed)
[facility] Provider Note: Christie Dell, MD, FACEP  CSN: [id number] MRN: [id number] ARRIVAL: 07/23/20 at 0531 ROOM: DB010/DB010   CHIEF COMPLAINT  Urinary Tract Infection   HISTORY OF PRESENT ILLNESS  07/23/20 5:47 AM Christie Lara is a 55 y.o. female who underwent robotic assisted laparoscopic pyeloplasty and left stent placement on 07/15/2020 by Dr. Cardell Peach.  She is here with dysuria (burning discomfort) with urination.  She is not having any flank pain, fever, back pain or chills.  Her urologic abnormality (unobstructed left ureter) was discovered in February of this year when she was admitted for urosepsis.  Her urine at that time grew out pansensitive E. coli.   History reviewed. No pertinent past medical history.  Past Surgical History:  Procedure Laterality Date   HERNIA REPAIR     ROBOT ASSISTED PYELOPLASTY Left 07/15/2020   Procedure: XI ROBOTIC ASSISTED LAPAROSCOPIC  PYELOPLASTY/ LEFT STENT PLACEMENT;  Surgeon: Jannifer Hick, MD;  Location: [facility];  Service: Urology;  Laterality: Left;   WISDOM TOOTH EXTRACTION      Family History  Problem Relation Age of Onset   Hyperlipidemia Mother    Diabetes Father    Hypertension Father    Cancer Father     Social History   Tobacco Use   Smoking status: Never   Smokeless tobacco: Never  Vaping Use   Vaping Use: Never used  Substance Use Topics   Alcohol use: Yes    Comment: glass of wine at dinner   Drug use: Never    Prior to Admission medications   Medication Sig Start Date End Date Taking? Authorizing Provider  cefpodoxime (VANTIN) 200 MG tablet Take 1 tablet (200 mg total) by mouth 2 (two) times daily. 07/23/20  Yes Malvin Morrish, MD  docusate sodium (COLACE) 100 MG capsule Take 1 capsule (100 mg total) by mouth daily as needed for up to 30 doses. 07/15/20   Jannifer Hick, MD  FLUoxetine (PROZAC) 20 MG tablet Take 1 tablet (20 mg total) by mouth daily. 10/29/11   Jaymes Graff, MD  loratadine (CLARITIN)  10 MG tablet Take 10 mg by mouth daily.    [provider]  meloxicam (MOBIC) 7.5 MG tablet Take 7.5 mg by mouth daily. 06/26/20   [provider]  oxyCODONE-acetaminophen (PERCOCET) 5-325 MG tablet Take 1 tablet by mouth every 4 (four) hours as needed for up to 20 doses for severe pain. 07/15/20   Jannifer Hick, MD  rosuvastatin (CRESTOR) 10 MG tablet Take 10 mg by mouth at bedtime. 04/25/20   [provider]    Allergies Sulfamethoxazole-trimethoprim, Elemental sulfur, Sulfa antibiotics, and Erythromycin   REVIEW OF SYSTEMS  Negative except as noted here or in the History of Present Illness.   PHYSICAL EXAMINATION  Initial Vital Signs Blood pressure 140/79, pulse 70, temperature 98.5 F (36.9 C), temperature source Oral, resp. rate 18, height 5' 6.5" (1.689 m), weight 81.6 kg, last menstrual period 09/29/2013, SpO2 100 %.  Examination General: Well-developed, well-nourished female in no acute distress; appearance consistent with age of record HENT: normocephalic; atraumatic Eyes: Normal appearance Neck: supple Heart: regular rate and rhythm Lungs: clear to auscultation bilaterally Abdomen: soft; nondistended; mild suprapubic tenderness; bowel sounds present Extremities: No deformity; full range of motion; pulses normal Neurologic: Awake, alert and oriented; motor function intact in all extremities and symmetric; no facial droop Skin: Warm and dry Psychiatric: Normal mood and affect   RESULTS  Summary of this visit's results, reviewed and interpreted  by myself:   EKG Interpretation  Date/Time:    Ventricular Rate:    PR Interval:    QRS Duration:   QT Interval:    QTC Calculation:   R Axis:     Text Interpretation:         Laboratory Studies: Results for orders placed or performed during the hospital encounter of 07/23/20 (from the past 24 hour(s))  Urinalysis, Routine w reflex microscopic Urine, Clean Catch     Status: Abnormal    Collection Time: 07/23/20  5:44 AM  Result Value Ref Range   Color, Urine COLORLESS (A) YELLOW   APPearance HAZY (A) CLEAR   Specific Gravity, Urine <1.005 (L) 1.005 - 1.030   pH 6.5 5.0 - 8.0   Glucose, UA NEGATIVE NEGATIVE mg/dL   Hgb urine dipstick LARGE (A) NEGATIVE   Bilirubin Urine NEGATIVE NEGATIVE   Ketones, ur NEGATIVE NEGATIVE mg/dL   Protein, ur 932 (A) NEGATIVE mg/dL   Nitrite NEGATIVE NEGATIVE   Leukocytes,Ua LARGE (A) NEGATIVE   RBC / HPF 0-5 0 - 5 RBC/hpf   WBC, UA >50 (H) 0 - 5 WBC/hpf   Squamous Epithelial / LPF 0-5 0 - 5   WBC Clumps PRESENT    Imaging Studies: No results found.  ED COURSE and MDM  Nursing notes, initial and subsequent vitals signs, including pulse oximetry, reviewed and interpreted by myself.  Vitals:   07/23/20 0539 07/23/20 0540  BP: 140/79   Pulse: 70   Resp: 18   Temp: 98.5 F (36.9 C)   TempSrc: Oral   SpO2: 100%   Weight:  81.6 kg  Height:  5' 6.5" (1.689 m)   Medications - No data to display  Urinalysis is consistent with a urinary tract infection.  Because this involves a stent in the context of recent urinary tract surgery urine has been sent for culture.  We will start her on a cephalosporin and have her follow-up with Dr. Cardell Peach.  PROCEDURES  Procedures   ED DIAGNOSES     ICD-10-CM   1. Lower urinary tract infection  N39.0     2. Status post placement of ureteral stent  Z96.0          Eesha Schmaltz, MD 07/23/20 [phone]

## 2020-07-25 LAB — URINE CULTURE: Culture: 40000 — AB

## 2020-07-26 ENCOUNTER — Telehealth: Payer: Self-pay | Admitting: Emergency Medicine

## 2020-07-26 NOTE — Telephone Encounter (Signed)
Post ED Visit - Positive Culture Follow-up  Culture report reviewed by antimicrobial stewardship pharmacist: [facility] []  , Pharm.D. []  Enzo Bi, Pharm.D., BCPS AQ-ID []  , Pharm.D., BCPS []  Celedonio Miyamoto, Pharm.D., BCPS []  [place], Garvin Fila.D., BCPS, AAHIVP []  , Pharm.D., BCPS, AAHIVP []  Georgina Pillion, PharmD, BCPS []  , PharmD, BCPS []  [place], PharmD, BCPS []  [address], PharmD []  , PharmD, BCPS []  Estella Husk, PharmD  Pharmacy Team []  Lysle Pearl, PharmD []  , PharmD []  Phillips Climes, PharmD []  , Rph []  Agapito Games) , PharmD []  Verlan Friends, PharmD []  , PharmD []  Mervyn Gay, PharmD []  , PharmD []  Vinnie Level, PharmD []  [facility], PharmD []  , PharmD []  Len Childs, PharmD   Positive urine culture Treated with cefpodoxime, organism sensitive to the same and no further patient follow-up is required at this time.  07/26/2020, 11:23 AM

## 2020-08-15 ENCOUNTER — Telehealth: Payer: BC Managed Care – PPO | Admitting: Nurse Practitioner

## 2020-08-15 DIAGNOSIS — N3000 Acute cystitis without hematuria: Secondary | ICD-10-CM | POA: Diagnosis not present

## 2020-08-15 MED ORDER — CEPHALEXIN 500 MG PO CAPS: 500.0000 mg | ORAL_CAPSULE | Freq: Two times a day (BID) | ORAL | 0 refills | Status: DC

## 2020-08-15 NOTE — Progress Notes (Signed)

## 2020-08-25 ENCOUNTER — Other Ambulatory Visit: Payer: Self-pay | Admitting: Urology

## 2020-08-25 DIAGNOSIS — N130 Hydronephrosis with ureteropelvic junction obstruction: Secondary | ICD-10-CM

## 2020-09-23 ENCOUNTER — Encounter (HOSPITAL_COMMUNITY): Payer: BC Managed Care – PPO

## 2020-09-23 ENCOUNTER — Encounter (HOSPITAL_COMMUNITY): Payer: Self-pay

## 2020-09-27 ENCOUNTER — Ambulatory Visit (HOSPITAL_COMMUNITY)
Admission: RE | Admit: 2020-09-27 | Discharge: 2020-09-27 | Disposition: A | Discharge status: Home / Self Care | Payer: BC Managed Care – PPO | Source: Ambulatory Visit | Attending: Urology | Admitting: Urology

## 2020-09-27 DIAGNOSIS — N130 Hydronephrosis with ureteropelvic junction obstruction: Secondary | ICD-10-CM | POA: Insufficient documentation

## 2020-09-27 MED ORDER — TECHNETIUM TC 99M MERTIATIDE
5.2000 | Freq: Once | INTRAVENOUS | Status: AC | PRN
  Administered 2020-09-27: 5.2 via INTRAVENOUS

## 2020-09-27 MED ORDER — FUROSEMIDE 10 MG/ML IJ SOLN
INTRAMUSCULAR | Status: AC
  Filled 2020-09-27: qty 4

## 2020-09-27 MED ORDER — FUROSEMIDE 10 MG/ML IJ SOLN
40.0000 mg | Freq: Once | INTRAMUSCULAR | Status: AC
  Administered 2020-09-27: 40 mg via INTRAVENOUS

## 2021-02-23 ENCOUNTER — Other Ambulatory Visit: Payer: Self-pay | Admitting: Physician Assistant

## 2021-02-23 DIAGNOSIS — Z1231 Encounter for screening mammogram for malignant neoplasm of breast: Secondary | ICD-10-CM

## 2021-03-02 ENCOUNTER — Ambulatory Visit
Admission: RE | Admit: 2021-03-02 | Discharge: 2021-03-02 | Disposition: A | Discharge status: Home / Self Care | Payer: BC Managed Care – PPO | Source: Ambulatory Visit | Attending: Physician Assistant | Admitting: Physician Assistant

## 2021-03-02 ENCOUNTER — Other Ambulatory Visit: Payer: Self-pay

## 2021-03-02 DIAGNOSIS — Z1231 Encounter for screening mammogram for malignant neoplasm of breast: Secondary | ICD-10-CM

## 2021-09-25 ENCOUNTER — Emergency Department (HOSPITAL_BASED_OUTPATIENT_CLINIC_OR_DEPARTMENT_OTHER): Payer: BC Managed Care – PPO | Admitting: Radiology

## 2021-09-25 ENCOUNTER — Other Ambulatory Visit: Payer: Self-pay

## 2021-09-25 ENCOUNTER — Emergency Department (HOSPITAL_BASED_OUTPATIENT_CLINIC_OR_DEPARTMENT_OTHER)
Admission: EM | Admit: 2021-09-25 | Discharge: 2021-09-25 | Disposition: A | Discharge status: Home / Self Care | Payer: BC Managed Care – PPO | Attending: Emergency Medicine | Admitting: Emergency Medicine

## 2021-09-25 ENCOUNTER — Encounter (HOSPITAL_BASED_OUTPATIENT_CLINIC_OR_DEPARTMENT_OTHER): Payer: Self-pay | Admitting: Emergency Medicine

## 2021-09-25 DIAGNOSIS — Z23 Encounter for immunization: Secondary | ICD-10-CM | POA: Diagnosis not present

## 2021-09-25 DIAGNOSIS — W231XXA Caught, crushed, jammed, or pinched between stationary objects, initial encounter: Secondary | ICD-10-CM | POA: Insufficient documentation

## 2021-09-25 DIAGNOSIS — S60941A Unspecified superficial injury of left index finger, initial encounter: Secondary | ICD-10-CM | POA: Diagnosis present

## 2021-09-25 DIAGNOSIS — S61211A Laceration without foreign body of left index finger without damage to nail, initial encounter: Secondary | ICD-10-CM | POA: Diagnosis not present

## 2021-09-25 MED ORDER — LIDOCAINE HCL 2 % IJ SOLN
10.0000 mL | Freq: Once | INTRAMUSCULAR | Status: DC
  Filled 2021-09-25: qty 20

## 2021-09-25 MED ORDER — TETANUS-DIPHTH-ACELL PERTUSSIS 5-2.5-18.5 LF-MCG/0.5 IM SUSY
0.5000 mL | PREFILLED_SYRINGE | Freq: Once | INTRAMUSCULAR | Status: AC
  Administered 2021-09-25: 0.5 mL via INTRAMUSCULAR
  Filled 2021-09-25: qty 0.5

## 2021-09-25 MED ORDER — LIDOCAINE HCL (PF) 1 % IJ SOLN: 10.0000 mL | Freq: Once | INTRAMUSCULAR | Status: DC

## 2021-09-25 MED ORDER — LIDOCAINE HCL (PF) 1 % IJ SOLN
10.0000 mL | Freq: Once | INTRAMUSCULAR | Status: AC
  Administered 2021-09-25: 10 mL via INTRADERMAL

## 2021-09-25 NOTE — ED Provider Notes (Signed)
..  Laceration Repair  Date/Time: 09/25/2021 11:41 PM  Performed by: Jacqlyn Larsen, PA-C Authorized by: Jacqlyn Larsen, PA-C   Consent:    Consent obtained:  Verbal   Consent given by:  Patient   Risks, benefits, and alternatives were discussed: yes     Risks discussed:  Infection, pain, need for additional repair, poor cosmetic result and poor wound healing   Alternatives discussed:  No treatment Universal protocol:    Procedure explained and questions answered to patient or proxy's satisfaction: yes     Patient identity confirmed:  Verbally with patient Anesthesia:    Anesthesia method:  Nerve block   Block needle gauge:  25 G   Block anesthetic:  Lidocaine 1% w/o epi   Block injection procedure:  Anatomic landmarks identified, introduced needle, incremental injection and anatomic landmarks palpated   Block outcome:  Anesthesia achieved Laceration details:    Location:  Finger   Finger location:  L index finger   Length (cm):  2   Depth (mm):  3 Pre-procedure details:    Preparation:  Patient was prepped and draped in usual sterile fashion Exploration:    Hemostasis achieved with:  Direct pressure   Wound exploration: wound explored through full range of motion and entire depth of wound visualized     Wound extent: areolar tissue violated   Treatment:    Area cleansed with:  Saline   Amount of cleaning:  Standard   Irrigation solution:  Sterile saline   Debridement:  None   Undermining:  None Skin repair:    Repair method:  Sutures   Suture size:  5-0   Suture material:  Prolene   Suture technique:  Simple interrupted   Number of sutures:  3 Approximation:    Approximation:  Close Repair type:    Repair type:  Simple Post-procedure details:    Dressing:  Adhesive bandage   Procedure completion:  Tolerated well, no immediate complications     Jacqlyn Larsen, PA-C 09/25/21 2343    Molpus, Jenny Reichmann, MD 09/26/21 1610

## 2021-09-25 NOTE — ED Notes (Signed)
Wound bandaged with telfa and coband.   Pt verbalizes understanding of discharge instructions. Opportunity for questioning and answers were provided. Pt discharged from ED to home.

## 2021-09-25 NOTE — ED Provider Notes (Signed)
[facility] Provider Note: [name], MD, FACEP  CSN: [id number] MRN: [id number] ARRIVAL: 09/25/21 at 2044 ROOM: DB016/DB016   CHIEF COMPLAINT  Finger Injury   HISTORY OF PRESENT ILLNESS  09/25/21 11:04 PM Christie Lara is a 56 y.o. female who got her left index finger pinched in a small stepstool about 8 PM.  She has a laceration to the pad of that finger.  She rates associated pain as a 2 out of 10.  Sensation and movement are intact.  Tetanus is not up-to-date.   History reviewed. No pertinent past medical history.  Past Surgical History:  Procedure Laterality Date   HERNIA REPAIR     ROBOT ASSISTED PYELOPLASTY Left 07/15/2020   Procedure: XI ROBOTIC ASSISTED LAPAROSCOPIC  PYELOPLASTY/ LEFT STENT PLACEMENT;  Surgeon: Jannifer Hick, MD;  Location: [facility];  Service: Urology;  Laterality: Left;   WISDOM TOOTH EXTRACTION      Family History  Problem Relation Age of Onset   Hyperlipidemia Mother    Diabetes Father    Hypertension Father    Cancer Father     Social History   Tobacco Use   Smoking status: Never   Smokeless tobacco: Never  Vaping Use   Vaping Use: Never used  Substance Use Topics   Alcohol use: Yes    Comment: glass of wine at dinner   Drug use: Never    Prior to Admission medications   Medication Sig Start Date End Date Taking? Authorizing Provider  cefpodoxime (VANTIN) 200 MG tablet Take 1 tablet (200 mg total) by mouth 2 (two) times daily. 07/23/20   Deretha Ertle, MD  cephALEXin (KEFLEX) 500 MG capsule Take 1 capsule (500 mg total) by mouth 2 (two) times daily. 08/15/20   Daphine Deutscher, Mary-Margaret, FNP  docusate sodium (COLACE) 100 MG capsule Take 1 capsule (100 mg total) by mouth daily as needed for up to 30 doses. 07/15/20   Jannifer Hick, MD  fluconazole (DIFLUCAN) 150 MG tablet Take 1 tablet as needed for vaginal yeast infection.  May repeat in 3 days if symptoms persist. 07/23/20   Samyia Motter, Jonny Ruiz, MD  FLUoxetine (PROZAC) 20 MG tablet  Take 1 tablet (20 mg total) by mouth daily. 10/29/11   Jaymes Graff, MD  loratadine (CLARITIN) 10 MG tablet Take 10 mg by mouth daily.    [provider]  meloxicam (MOBIC) 7.5 MG tablet Take 7.5 mg by mouth daily. 06/26/20   [provider]  oxyCODONE-acetaminophen (PERCOCET) 5-325 MG tablet Take 1 tablet by mouth every 4 (four) hours as needed for up to 20 doses for severe pain. 07/15/20   Jannifer Hick, MD  rosuvastatin (CRESTOR) 10 MG tablet Take 10 mg by mouth at bedtime. 04/25/20   [provider]    Allergies Sulfamethoxazole-trimethoprim, Elemental sulfur, Sulfa antibiotics, and Erythromycin   REVIEW OF SYSTEMS  Negative except as noted here or in the History of Present Illness.   PHYSICAL EXAMINATION  Initial Vital Signs Blood pressure 126/80, pulse 70, temperature 98.4 F (36.9 C), resp. rate 16, last menstrual period 09/29/2013, SpO2 96 %.  Examination General: Well-developed, well-nourished female in no acute distress; appearance consistent with age of record HENT: normocephalic; atraumatic Eyes: Normal appearance Neck: supple Heart: regular rate and rhythm Lungs: clear to auscultation bilaterally Abdomen: soft; nondistended; nontender; bowel sounds present Extremities: No deformity; laceration to pad of left index finger, flexion and extension intact at DIP joint, sensation intact distally Neurologic: Awake, alert and oriented; motor function  intact in all extremities and symmetric; no facial droop Skin: Warm and dry Psychiatric: Normal mood and affect   RESULTS  Summary of this visit's results, reviewed and interpreted by myself:   EKG Interpretation  Date/Time:    Ventricular Rate:    PR Interval:    QRS Duration:   QT Interval:    QTC Calculation:   R Axis:     Text Interpretation:         Laboratory Studies: No results found for this or any previous visit (from the past 24 hour(s)). Imaging Studies: DG Finger Index  Left  Result Date: 09/25/2021 CLINICAL DATA:  [address] and crashed under the stool. Laceration to the finger. EXAM: LEFT INDEX FINGER 2+V COMPARISON:  None Available. FINDINGS: There is no evidence of fracture or dislocation. Small osseous fragment about the insertion of the extensor tendon, which may be sequela of prior trauma. Generalized soft tissue swelling IMPRESSION: 1. No acute fracture or dislocation. 2. Small osseous fragment about the dorsal aspect of the base of the distal phalanx about the insertion of the extensor tendon, without localized edema. It may be sequela of prior trauma. Electronically Signed   By: Keane Police D.O.   On: 09/25/2021 21:53    ED COURSE and MDM  Nursing notes, initial and subsequent vitals signs, including pulse oximetry, reviewed and interpreted by myself.  Vitals:   09/25/21 2103  BP: 126/80  Pulse: 70  Resp: 16  Temp: 98.4 F (36.9 C)  SpO2: 96%   Medications  lidocaine (XYLOCAINE) 2 % (with pres) injection 200 mg (has no administration in time range)  Tdap (BOOSTRIX) injection 0.5 mL (has no administration in time range)   The location of the bony fragment seen on radiograph is on the dorsal aspect of the finger and not near the laceration nor is there dorsal tenderness.  This is most consistent with a remote injury and not associated with the current injury.  Tetanus updated due to open wound of finger.  PROCEDURES  Procedures   ED DIAGNOSES     ICD-10-CM   1. Laceration of left index finger without foreign body without damage to nail, initial encounter  Z30.865H          Shanon Rosser, MD 09/25/21 2312

## 2021-09-25 NOTE — ED Triage Notes (Signed)
Left pointer finger injury, fell and crushed/pinched under stool, lac to finger  Approx 2cm lac, bleeding controled, irrigated and dressed in triage

## 2021-09-25 NOTE — Discharge Instructions (Addendum)
1. Medications: Tylenol or ibuprofen for pain, usual home medications 2. Treatment: ice for swelling, keep wound clean with warm soap and water and keep bandage dry, do not submerge in water for 24 hours 3. Follow Up: Please return or follow up with primary care provider in 7-10 days to have your stitches/staples removed or sooner if you have concerns. Return to the emergency department for increased redness, drainage of pus from the wound, or fevers/chills.   WOUND CARE  Keep area clean and dry for 24 hours. Do not remove bandage, if applied.  After 24 hours, remove bandage and wash wound gently with mild soap and warm water. Reapply a new bandage after cleaning wound, if directed.   Continue daily cleansing with soap and water until stitches/staples are removed.  Do not apply any ointments or creams to the wound while stitches/staples are in place, as this may cause delayed healing. Return if you experience any of the following signs of infection: Swelling, redness, pus drainage, streaking, fever >101.0 F  Return if you experience excessive bleeding that does not stop after 15-20 minutes of constant, firm pressure.  

## 2021-12-05 ENCOUNTER — Other Ambulatory Visit: Payer: Self-pay | Admitting: Internal Medicine

## 2021-12-05 DIAGNOSIS — Z0000 Encounter for general adult medical examination without abnormal findings: Secondary | ICD-10-CM

## 2022-03-13 ENCOUNTER — Ambulatory Visit: Payer: BC Managed Care – PPO

## 2022-05-01 ENCOUNTER — Ambulatory Visit
Admission: RE | Admit: 2022-05-01 | Discharge: 2022-05-01 | Disposition: A | Discharge status: Home / Self Care | Payer: BC Managed Care – PPO | Source: Ambulatory Visit | Attending: Internal Medicine | Admitting: Internal Medicine

## 2022-05-01 DIAGNOSIS — Z0000 Encounter for general adult medical examination without abnormal findings: Secondary | ICD-10-CM

## 2022-07-05 ENCOUNTER — Ambulatory Visit
Admission: RE | Admit: 2022-07-05 | Discharge: 2022-07-05 | Disposition: A | Discharge status: Home / Self Care | Payer: BC Managed Care – PPO | Source: Ambulatory Visit | Attending: Family Medicine | Admitting: Family Medicine

## 2022-07-05 VITALS — BP 116/74 | HR 76 | Temp 98.2°F | Resp 16

## 2022-07-05 DIAGNOSIS — L255 Unspecified contact dermatitis due to plants, except food: Secondary | ICD-10-CM | POA: Diagnosis not present

## 2022-07-05 MED ORDER — PREDNISONE 10 MG (48) PO TBPK: ORAL_TABLET | ORAL | 0 refills | Status: AC

## 2022-07-05 NOTE — ED Triage Notes (Signed)
Pt presents to UC w/ c/o poison ivy rash on chest, left side of face, right forearm x3 days. Hydrocortisone cream did not help.

## 2022-07-05 NOTE — ED Provider Notes (Signed)
  [facility]   [id number] 07/05/22 Arrival Time: 1055  ASSESSMENT & PLAN:  1. Rhus dermatitis    No signs of skin infection. Begin: Meds ordered this encounter  Medications   predniSONE (STERAPRED UNI-PAK 48 TAB) 10 MG (48) TBPK tablet    Sig: Take as directed.    Dispense:  48 tablet    Refill:  0    Will follow up with PCP or here if worsening or failing to improve as anticipated. Reviewed expectations re: course of current medical issues. Questions answered. Outlined signs and symptoms indicating need for more acute intervention. Patient verbalized understanding. After Visit Summary given.   SUBJECTIVE:  Christie Lara is a 57 y.o. female who presents with a skin complaint. Pt presents to UC w/ c/o poison ivy rash on chest, left side of face, right forearm x3 days. Hydrocortisone cream did not help.   OBJECTIVE: Vitals:   07/05/22 1110  BP: 116/74  Pulse: 76  Resp: 16  Temp: 98.2 F (36.8 C)  TempSrc: Oral  SpO2: 95%    General appearance: alert; no distress HEENT: [place]; AT Extremities: no edema; moves all extremities normally Skin: warm and dry; signs of infection: areas of linear papules and vesicles with surrounding erythema over upper chest wall, above L eye, and on R forearm Psychological: alert and cooperative; normal mood and affect  Allergies  Allergen Reactions   Sulfamethoxazole-Trimethoprim Nausea And Vomiting and Nausea Only    Other reaction(s): Abdominal Pain, Fever (intolerance)   Elemental Sulfur    Sulfa Antibiotics    Erythromycin Nausea And Vomiting and Nausea Only    History reviewed. No pertinent past medical history. Social History   Socioeconomic History   Marital status: Married    Spouse name: Not on file   Number of children: Not on file   Years of education: Not on file   Highest education level: Not on file  Occupational History   Not on file  Tobacco Use   Smoking status: Never   Smokeless tobacco: Never   Vaping Use   Vaping Use: Never used  Substance and Sexual Activity   Alcohol use: Yes    Comment: glass of wine at dinner   Drug use: Never   Sexual activity: Yes    Birth control/protection: Surgical    Comment: husband vasectomy  Other Topics Concern   Not on file  Social History Narrative   Not on file   Social Determinants of Health   Financial Resource Strain: Not on file  Food Insecurity: Not on file  Transportation Needs: Not on file  Physical Activity: Not on file  Stress: Not on file  Social Connections: Not on file  Intimate Partner Violence: Not on file   Family History  Problem Relation Age of Onset   Hyperlipidemia Mother    Diabetes Father    Hypertension Father    Cancer Father    Past Surgical History:  Procedure Laterality Date   HERNIA REPAIR     ROBOT ASSISTED PYELOPLASTY Left 07/15/2020   Procedure: XI ROBOTIC ASSISTED LAPAROSCOPIC  PYELOPLASTY/ LEFT STENT PLACEMENT;  Surgeon: Jannifer Hick, MD;  Location: [facility];  Service: Urology;  Laterality: Left;   WISDOM TOOTH EXTRACTION        Mardella Layman, MD 07/05/22 1133

## 2022-09-24 IMAGING — NM NM RENAL IMAGING FLOW W/ PHARM
2 series · 12 of 12 positions shown · non-contrast
Comparison: 03/30/2020

CLINICAL DATA: Acquired hydronephrosis with UPJ obstruction, post
LEFT pyeloplasty and stent placement, stent removed August 2020

EXAM:
NUCLEAR MEDICINE RENAL SCAN WITH DIURETIC ADMINISTRATION
TECHNIQUE: Radionuclide angiographic and sequential renal images were obtained
after intravenous injection of radiopharmaceutical. Imaging was
continued during slow intravenous injection of Lasix approximately
15 minutes after the start of the examination.
RADIOPHARMACEUTICALS:  5.2 mCi Aechnetium-TTm MAG3 IV
Pharmaceutical: Lasix 40 mg IV

[Series 1: renal scan · 4.14mm/px · 6 of 82 frames shown (1 of 2)]
[frame 7/82]
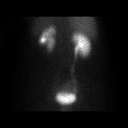
[frame 21/82]
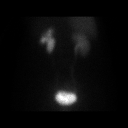
[frame 35/82]
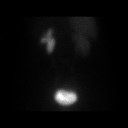
[frame 48/82]
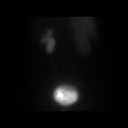
[frame 62/82]
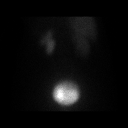
[frame 76/82]
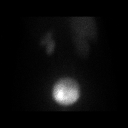

[Series 1: renal scan · 4.14mm/px · 6 of 40 frames shown (2 of 2)]
[frame 4/40  full-range]
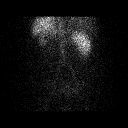
[frame 10/40  full-range]
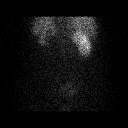
[frame 17/40  full-range]
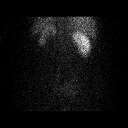
[frame 24/40  full-range]
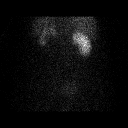
[frame 30/40  full-range]
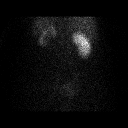
[frame 37/40  full-range]
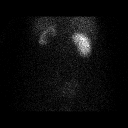

[12 of 12 positions shown; findings below may reference images not displayed]

FINDINGS: Flow: Prompt blood flow to RIGHT kidney. Diminished blood flow to
LEFT kidney.

Left renogram: Smaller LEFT kidney than RIGHT. Diminished uptake,
concentration and excretion of tracer by LEFT kidney. Tracer is
excreted into a significantly dilated LEFT renal collecting system
and LEFT renal pelvis. Partial clearance of tracer prior to Lasix,
accelerating following diuretic administration. Only mild tracer
residual is seen within the renal pelvis at the conclusion of the
exam. Analysis of the renogram curve demonstrates a normal time to
peak activity of 4.2 minutes with fall to half maximum activity
minutes later.

Right renogram: Normal uptake, concentration and excretion of tracer
by RIGHT kidney. Good clearance before and continuing following
Lasix administration. No tracer retention at conclusion of exam.
Analysis of the renogram curve demonstrates a normal time to peak
activity of 2.0 minutes with fall to half maximum activity
minutes later.

Differential:

Left kidney = 23 %

Right kidney = 77 %

T1/2 post Lasix :

Left kidney = 18.9 min

Right kidney = 13.6 min
IMPRESSION: Dilated LEFT renal collecting system though there is good clearance
of tracer following diuretic administration, indicating an absence
of significant urinary outflow obstruction.

Markedly asymmetric renal function, 23% LEFT versus 77% RIGHT, as
compared to the previous exam when it was calculated at 20% LEFT
versus 80% RIGHT.

## 2022-10-11 ENCOUNTER — Other Ambulatory Visit: Payer: Self-pay | Admitting: Medical Genetics

## 2022-10-11 DIAGNOSIS — Z006 Encounter for examination for normal comparison and control in clinical research program: Secondary | ICD-10-CM

## 2023-01-16 ENCOUNTER — Ambulatory Visit: Payer: 59 | Admitting: Podiatry

## 2023-01-24 ENCOUNTER — Encounter: Payer: Self-pay | Admitting: Podiatry

## 2023-01-24 ENCOUNTER — Ambulatory Visit: Payer: 59 | Admitting: Podiatry

## 2023-01-24 VITALS — Ht 66.5 in | Wt 180.0 lb

## 2023-01-24 DIAGNOSIS — M21612 Bunion of left foot: Secondary | ICD-10-CM | POA: Diagnosis not present

## 2023-01-24 DIAGNOSIS — M21611 Bunion of right foot: Secondary | ICD-10-CM | POA: Diagnosis not present

## 2023-01-24 DIAGNOSIS — L600 Ingrowing nail: Secondary | ICD-10-CM | POA: Diagnosis not present

## 2023-01-24 NOTE — Patient Instructions (Signed)

## 2023-01-25 NOTE — Progress Notes (Signed)
Subjective:   Patient ID: Christie Lara, female   DOB: 58 y.o.   MRN: [id number]   HPI Patient presents stating she has very deformed big toenails of both feet that are starting to become more bothersome and making shoe gear difficult.  States this has been going on for a long time worse over the last number of months.  Patient does not smoke likes to be active   Review of Systems  All other systems reviewed and are negative.       Objective:  Physical Exam Vitals and nursing note reviewed.  Constitutional:      Appearance: She is well-developed.  Pulmonary:     Effort: Pulmonary effort is normal.  Musculoskeletal:        General: Normal range of motion.  Skin:    General: Skin is warm.  Neurological:     Mental Status: She is alert.     Neurovascular status intact muscle strength was found to be adequate range of motion adequate with patient found to have severely deformed thickened big toenails of both feet that are dystrophic and painful when I press from a dorsal direction.  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  I explained the procedure risk and patient wants to have this done understanding risk and the fact that this will be permanent.  Patient signed consent form and today I infiltrated each big toe 60 mg Xylocaine Marcaine mixture sterile prep done and using sterile instrumentation removed the hallux nail exposed matrix applied phenol 5 applications 30 seconds followed by alcohol lavage sterile dressing gave instructions on soaks wear dressing 24 hours take it off earlier if throbbing were to occur and encouraged to call up with any questions concerns

## 2023-02-01 ENCOUNTER — Encounter: Payer: Self-pay | Admitting: Podiatry

## 2023-03-18 ENCOUNTER — Other Ambulatory Visit: Payer: Self-pay | Admitting: Internal Medicine

## 2023-03-18 DIAGNOSIS — Z1231 Encounter for screening mammogram for malignant neoplasm of breast: Secondary | ICD-10-CM

## 2023-05-02 ENCOUNTER — Other Ambulatory Visit: Payer: Self-pay | Admitting: Internal Medicine

## 2023-05-02 DIAGNOSIS — E2839 Other primary ovarian failure: Secondary | ICD-10-CM

## 2023-05-03 ENCOUNTER — Ambulatory Visit
Admission: RE | Admit: 2023-05-03 | Discharge: 2023-05-03 | Disposition: A | Discharge status: Home / Self Care | Payer: Self-pay | Source: Ambulatory Visit | Attending: Internal Medicine | Admitting: Internal Medicine

## 2023-05-03 DIAGNOSIS — Z1231 Encounter for screening mammogram for malignant neoplasm of breast: Secondary | ICD-10-CM

## 2023-09-26 ENCOUNTER — Other Ambulatory Visit (HOSPITAL_BASED_OUTPATIENT_CLINIC_OR_DEPARTMENT_OTHER)

## 2023-10-28 ENCOUNTER — Other Ambulatory Visit: Payer: Self-pay | Admitting: Medical Genetics

## 2023-10-28 DIAGNOSIS — Z006 Encounter for examination for normal comparison and control in clinical research program: Secondary | ICD-10-CM

## 2023-11-25 LAB — GENECONNECT MOLECULAR SCREEN: Genetic Analysis Overall Interpretation: NEGATIVE

## 2024-01-28 ENCOUNTER — Other Ambulatory Visit

## 2024-02-24 ENCOUNTER — Other Ambulatory Visit (HOSPITAL_BASED_OUTPATIENT_CLINIC_OR_DEPARTMENT_OTHER)
